# Patient Record
Sex: Male | Born: 1955 | Race: White | Hispanic: No | Marital: Married | Smoking: Former smoker
Health system: Southern US, Community
[De-identification: ages and names within clinical notes are randomized; demographics above are authoritative.]

## PROBLEM LIST (undated history)

## (undated) DIAGNOSIS — I10 Essential (primary) hypertension: Secondary | ICD-10-CM

## (undated) DIAGNOSIS — E119 Type 2 diabetes mellitus without complications: Secondary | ICD-10-CM

## (undated) DIAGNOSIS — Z803 Family history of malignant neoplasm of breast: Secondary | ICD-10-CM

## (undated) DIAGNOSIS — Z8042 Family history of malignant neoplasm of prostate: Secondary | ICD-10-CM

## (undated) DIAGNOSIS — N390 Urinary tract infection, site not specified: Secondary | ICD-10-CM

## (undated) DIAGNOSIS — T7840XA Allergy, unspecified, initial encounter: Secondary | ICD-10-CM

## (undated) DIAGNOSIS — K635 Polyp of colon: Secondary | ICD-10-CM

## (undated) DIAGNOSIS — E785 Hyperlipidemia, unspecified: Secondary | ICD-10-CM

## (undated) HISTORY — DX: Family history of malignant neoplasm of prostate: Z80.42

## (undated) HISTORY — PX: WISDOM TOOTH EXTRACTION: SHX21

## (undated) HISTORY — DX: Hyperlipidemia, unspecified: E78.5

## (undated) HISTORY — DX: Allergy, unspecified, initial encounter: T78.40XA

## (undated) HISTORY — DX: Type 2 diabetes mellitus without complications: E11.9

## (undated) HISTORY — PX: SURGERY SCROTAL / TESTICULAR: SUR1316

## (undated) HISTORY — DX: Polyp of colon: K63.5

## (undated) HISTORY — PX: CERVICAL DISC SURGERY: SHX588

## (undated) HISTORY — DX: Essential (primary) hypertension: I10

## (undated) HISTORY — DX: Family history of malignant neoplasm of breast: Z80.3

## (undated) HISTORY — PX: COLONOSCOPY: SHX174

## (undated) HISTORY — DX: Urinary tract infection, site not specified: N39.0

---

## 1999-01-25 ENCOUNTER — Emergency Department (HOSPITAL_COMMUNITY): Admission: EM | Admit: 1999-01-25 | Discharge: 1999-01-25 | Payer: Self-pay | Admitting: Emergency Medicine

## 2007-10-17 ENCOUNTER — Other Ambulatory Visit: Payer: Self-pay | Admitting: Neurosurgery

## 2007-10-23 ENCOUNTER — Observation Stay (HOSPITAL_COMMUNITY): Admission: RE | Admit: 2007-10-23 | Discharge: 2007-10-24 | Payer: Self-pay | Admitting: Neurosurgery

## 2007-10-25 ENCOUNTER — Emergency Department (HOSPITAL_COMMUNITY): Admission: EM | Admit: 2007-10-25 | Discharge: 2007-10-25 | Payer: Self-pay | Admitting: Emergency Medicine

## 2009-06-27 IMAGING — CR DG NECK SOFT TISSUE
2 series · 2 of 2 positions shown · non-contrast
Comparison: Neck soft tissues of 10/23/07.

CLINICAL DATA: Vomiting.
 NECK SOFT TISSUES:

[view not recorded (1 of 2)]
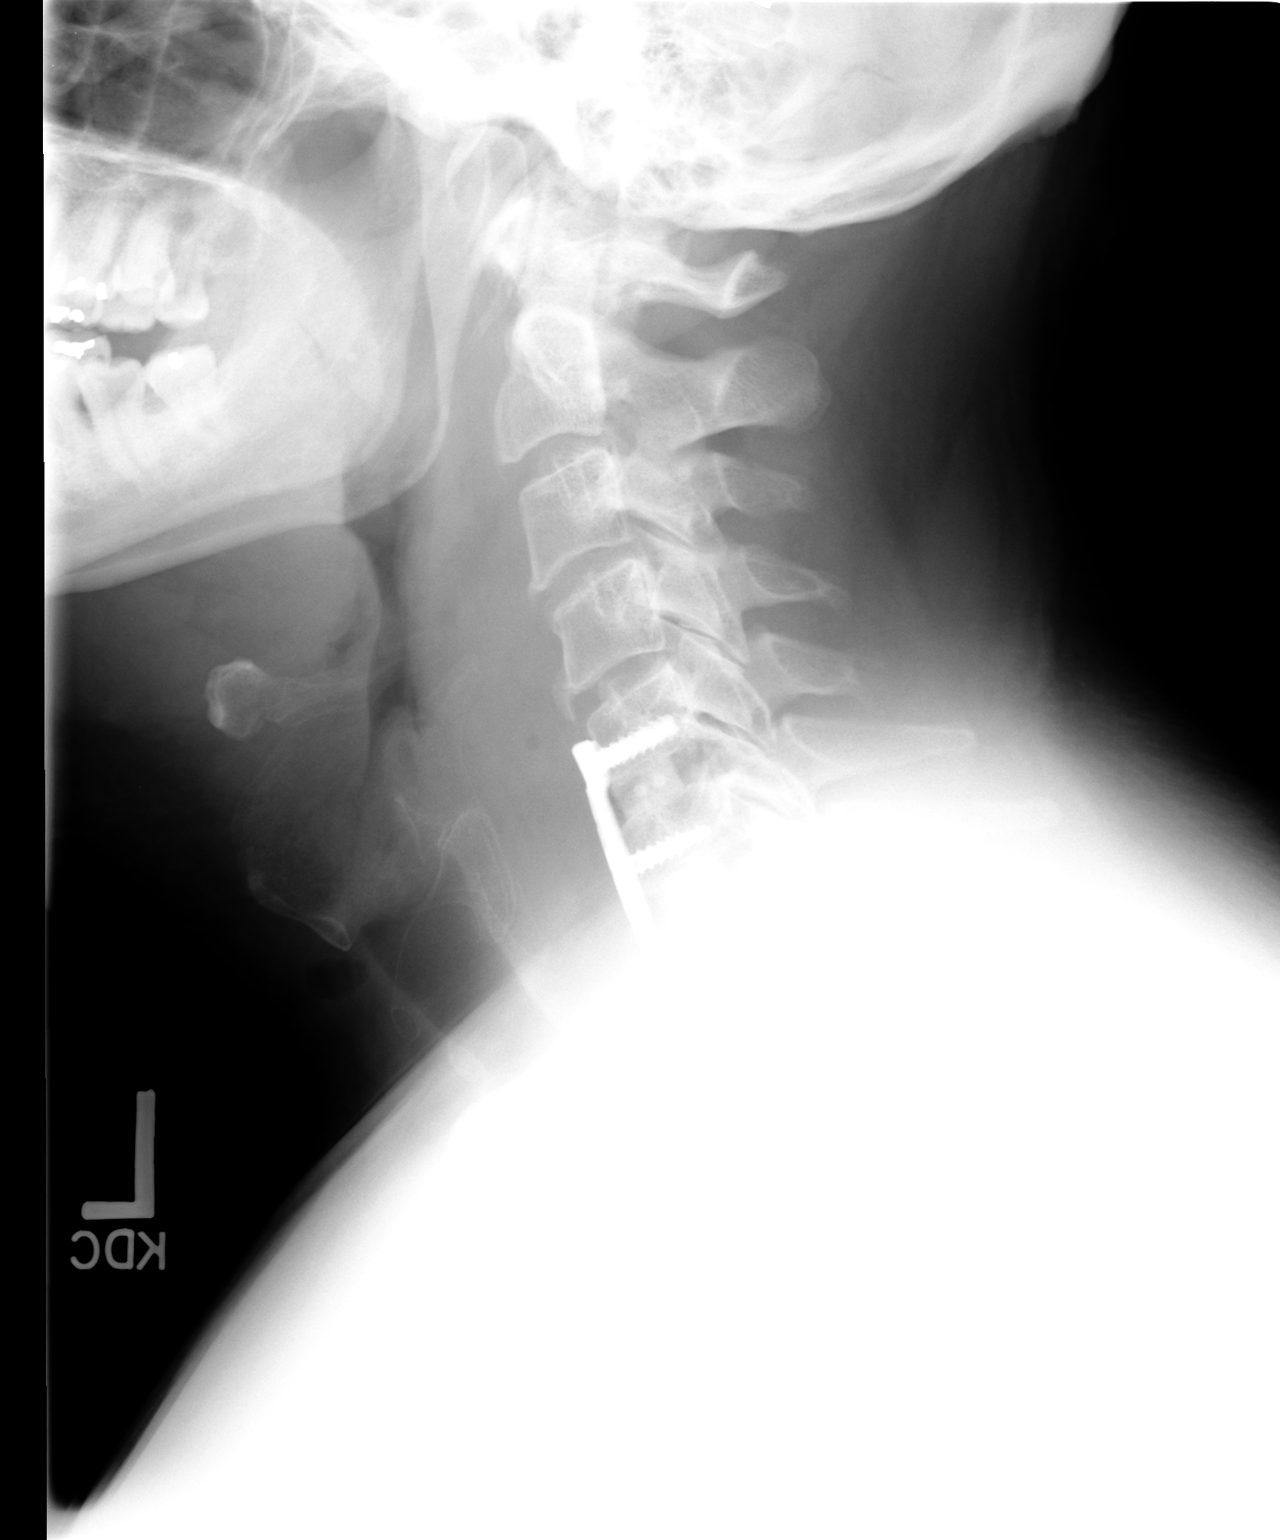

[view not recorded (2 of 2)]
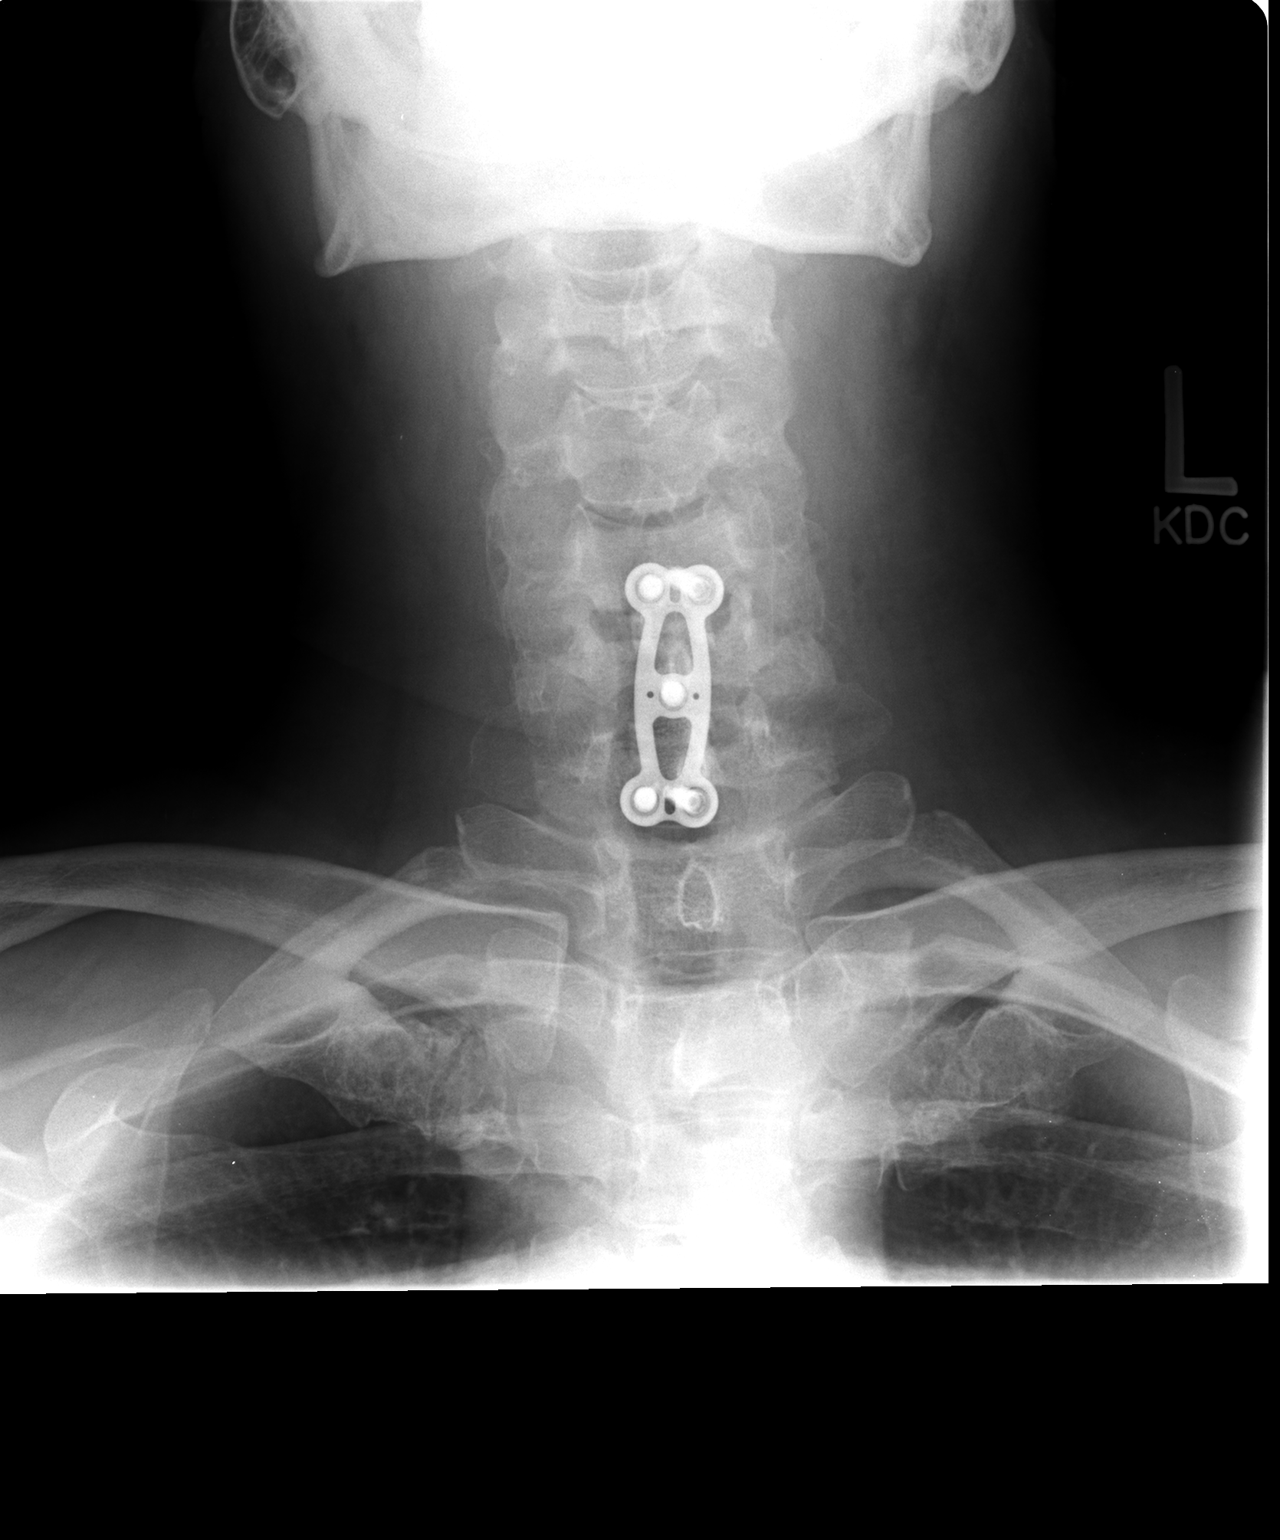

[2 of 2 positions shown; findings below may reference images not displayed]

FINDINGS: There is interval increase in prevertebral soft tissue swelling, which measures 3 mm from the anterior aspect of C4 compared to 20 mm on prior.  There is anterior cervical fixation beginning at C5.  There is a tiny round pocket of gas in the prevertebral soft tissue swelling measuring 3 mm.
IMPRESSION: Interval increase in swelling of prevertebral soft tissues.

## 2011-04-17 NOTE — Op Note (Signed)
Joshua [name], Joshua [name] NO.:  [id number]   MEDICAL RECORD NO.:  [id number]          PATIENT TYPE:  INP   LOCATION:  3012                         FACILITY:  [facility]   PHYSICIAN:  Hewitt Shorts, M.D.DATE OF BIRTH:  1956/09/11   DATE OF PROCEDURE:  DATE OF DISCHARGE:                               OPERATIVE REPORT   POSTOPERATIVE DIAGNOSES:  C5-6 and C6-7 cervical disc herniation,  cervical spondylosis, cervical degenerative disease, and cervical  radiculopathy.   POSTOPERATIVE DIAGNOSES:  C5-6 and C6-7 cervical disc herniation,  cervical spondylosis, cervical degenerative disease, and cervical  radiculopathy.   PROCEDURE:  C5-6 and C6-7 anterior cervical discectomy and arthrodesis  with Allograft and Tether cervical plating.   SURGEON:  Hewitt Shorts, M.D.   ASSISTANTS:  Nelia Shi. Georgina Peer and Stefani Dama, M.D.   ANESTHESIA:  General endotracheal.   INDICATIONS:  This is a pleasant 55 year old man who presented with  bilateral cervical radiculopathy.  He was found to have advanced  spondylosis and degenerative disease with superimposed disc herniation  at a C5-6 and C6-7 levels.  EMG nerve conduction studies also revealed  bilateral carpal tunnel syndrome.  Decision was made to proceed with  cervical decompression.  He may well require ventral carpal tunnel  decompression.   DESCRIPTION OF PROCEDURE:  The patient was brought to the operating room  and placed under general endotracheal anesthesia.  The patient was  placed on 10 pounds of Holter traction.  The neck was prepped with  Betadine soap and solution and draped in sterile fashion.  A horizontal  incision was made in the left side of neck.  The line of the incision  was treated with local anesthetic with epinephrine.  Dissection was  carried down to the subcutaneous tissue.  Bipolar cautery and  electrocautery was used to maintain hemostasis.  Dissection was then  carried out through  the platysma and then dissection was carried down  through an avascular plane leaving the sternocleidomastoid, carotid  artery, and jugular vein laterally, and trachea and esophagus medially.  The ventral aspect of the vertebral column was identified and localizing  x-ray taken and C5-6 and C6-7 intervertebral disc space was identified.  Discectomy was begun with incision of the annulus,  continued with  microcurette and pituitary rongeurs.  The microscope was draped and  brought into the field to provide additional navigation, illumination,  and visualization.  The remainder of the decompression was performed  using microdissection and microsurgical technique.  Significant anterior  osteophytic overgrowth was removed using an osteophyte removal tool  along with the X-Max drill.  The cauterized endplates were removed using  the microcurette along with the X-Max drill and then posterior  osteophytic overgrowth was removed using the X-Max drill and a 2-mm  Kerrison punch with a thin footplate.  The posterior longitudinal  ligament was carefully removed, and then we encountered disc herniation  that was similarly removed.  We were able to decompress the spinal canal  and thecal sac bilaterally at each level and then we turned our  attention to the neural  foramina at each level.  There was  significant  spondylitic encroachment at C5-6.  This was carefully removed and we  were able to decompress the exiting C6 and C7 nerve roots bilaterally at  each level.  We then established hemostasis once the decompression was  completed with the use of Gelfoam soaked in thrombin and once hemostasis  was established, we measured the height of the intravertebral disc  spaces.  We selected a 7-mm graft for C5-6 and then 8-mm graft for C6-7.  Each piece of allograft was hydrated in saline solution and then  positioned in the intravertebral disc space and countersunk.  We then  discontinued the cervical  traction and selected a 35-mm Tether cervical  plating.  It was positioned over the fusion construct and secured to the  vertebra with 4 x 15 mm variable-angle screws placing a pair of screws  at C5, and another pair at C7, and a single screw at C6.  Each screw  level was drilled and the screws placed in alternating fashion.  Once  all 5 screws were place, final tightening was performed, and then the  wound was irrigated with bacitracin solution and checked for hemostasis,  which was established and confirmed.  X-ray was taken, which only  visualized the superior aspect of the fusion construct with the screws  appearing in good position at C5 and the graft appeared in position at  the C5-6 level.  The remainder of the construct was obscured by his  shoulders.  We then again confirmed hemostasis and then proceeded with  closure.  The platysma was closed with interrupted inverted 2-0 running  Vicryl sutures.  The subcutaneous and subcuticular layer were closed  with interrupted inverted 3-0 running Vicryl sutures.  The skin was  reapproximated with Dermabond.  The procedure was tolerated well.  The  estimated blood loss was 50 mL.  Sponge counts were correct.  Following  surgery, the patient was reversed from the anesthetic, extubated, and  transferred to the recovery room for further care.      Hewitt Shorts, M.D.  Electronically Signed     RWN/MEDQ  D:  10/23/2007  T:  10/23/2007  Job:  [id number]

## 2011-09-11 LAB — COMPREHENSIVE METABOLIC PANEL
ALT: 49
AST: 28
Albumin: 4.1
Alkaline Phosphatase: 62
BUN: 17
CO2: 27
Calcium: 9.3
Chloride: 105
Creatinine, Ser: 0.98
GFR calc Af Amer: >60
GFR calc non Af Amer: 60 — ABNORMAL LOW
Glucose, Bld: 111 — ABNORMAL HIGH
Potassium: 4.8
Sodium: 137
Total Bilirubin: 0.7
Total Protein: 7.2

## 2011-09-11 LAB — CBC
HCT: 45.3
Hemoglobin: 15.7
MCHC: 34.6
MCV: 88.8
Platelets: 253
RBC: 5.11
RDW: 11.8
WBC: 9.5

## 2013-12-03 DIAGNOSIS — N390 Urinary tract infection, site not specified: Secondary | ICD-10-CM

## 2013-12-03 HISTORY — DX: Urinary tract infection, site not specified: N39.0

## 2014-02-12 ENCOUNTER — Encounter: Payer: Self-pay | Admitting: Gastroenterology

## 2014-03-31 ENCOUNTER — Encounter: Payer: Self-pay | Admitting: Gastroenterology

## 2014-10-08 ENCOUNTER — Encounter: Payer: Self-pay | Admitting: Gastroenterology

## 2014-11-29 ENCOUNTER — Ambulatory Visit (AMBULATORY_SURGERY_CENTER): Payer: Self-pay | Admitting: *Deleted

## 2014-11-29 VITALS — Ht 73.0 in | Wt 243.2 lb

## 2014-11-29 DIAGNOSIS — Z1211 Encounter for screening for malignant neoplasm of colon: Secondary | ICD-10-CM

## 2014-11-29 MED ORDER — MOVIPREP 100 G PO SOLR: 1.0000 | Freq: Once | ORAL | Status: DC

## 2014-11-29 NOTE — Progress Notes (Signed)
No home 02 use, no blood thinners. ewm  No egg or soy allergy. ewm  No issues with past sedation. ewm  No diet pills. ewm

## 2014-12-14 ENCOUNTER — Ambulatory Visit (AMBULATORY_SURGERY_CENTER): Payer: Commercial Managed Care - PPO | Admitting: Gastroenterology

## 2014-12-14 ENCOUNTER — Encounter: Payer: Self-pay | Admitting: Gastroenterology

## 2014-12-14 VITALS — BP 112/53 | HR 52 | Temp 97.9°F | Resp 16 | Ht 73.0 in | Wt 243.2 lb

## 2014-12-14 DIAGNOSIS — D120 Benign neoplasm of cecum: Secondary | ICD-10-CM

## 2014-12-14 DIAGNOSIS — Z1211 Encounter for screening for malignant neoplasm of colon: Secondary | ICD-10-CM

## 2014-12-14 DIAGNOSIS — D124 Benign neoplasm of descending colon: Secondary | ICD-10-CM

## 2014-12-14 DIAGNOSIS — D123 Benign neoplasm of transverse colon: Secondary | ICD-10-CM

## 2014-12-14 DIAGNOSIS — D125 Benign neoplasm of sigmoid colon: Secondary | ICD-10-CM

## 2014-12-14 LAB — GLUCOSE, CAPILLARY
GLUCOSE-CAPILLARY: 141 mg/dL — AB (ref 70–99)
GLUCOSE-CAPILLARY: 101 mg/dL — AB (ref 70–99)

## 2014-12-14 MED ORDER — SODIUM CHLORIDE 0.9 % IV SOLN: 500.0000 mL | INTRAVENOUS | Status: DC

## 2014-12-14 NOTE — Progress Notes (Signed)
Stable to RR 

## 2014-12-14 NOTE — Op Note (Signed)
[facility]  [address]. [place], [address]   COLONOSCOPY PROCEDURE REPORT  PATIENT: Joshua [name], Joshua [name]  MR#: [id number] BIRTHDATE: 06-Apr-1956 , 58  yrs. old GENDER: male ENDOSCOPIST: Milus Banister, MD REFERRED VO:UZHQUIQ Reynaldo Minium, M.D. PROCEDURE DATE:  12/14/2014 PROCEDURE:   Colonoscopy with snare polypectomy and Submucosal injection, any substance First Screening Colonoscopy - Avg.  risk and is 50 yrs.  old or older Yes.  Prior Negative Screening - Now for repeat screening. N/A  History of Adenoma - Now for follow-up colonoscopy & has been > or = to 3 yrs.  N/A  Polyps Removed Today? Yes. ASA CLASS:   Class II INDICATIONS:average risk for colon cancer. MEDICATIONS: Monitored anesthesia care, Propofol 450 mg IV, and lidocaine 40mg  IV  DESCRIPTION OF PROCEDURE:   After the risks benefits and alternatives of the procedure were thoroughly explained, informed consent was obtained.  The digital rectal exam revealed no abnormalities of the rectum.   The LB [id number]  endoscope was introduced through the anus and advanced to the cecum, which was identified by both the appendix and ileocecal valve. No adverse events experienced.   The quality of the prep was good.  The instrument was then slowly withdrawn as the colon was fully examined.  COLON FINDINGS: Thirteen polyps were found, removed and sent to pathology.  11 of these were sessile, ranged in size from 2-65mm across, located in ascending, descending and sigmoid segments, all removed with cold snare (jar 1).  One was 42mm, sessile, located in descending segment, removed with cold snare, (jar 2).  The last was semipedunculated, a bit ulcerated appearing, located in sigmoid segment, 41mm across, removed with snare/cautery (jar 3).  This last site was labeled with submucosal injection of Spot following resection.  The examination was otherwise normal.  Retroflexed views revealed no abnormalities. The time to  cecum=2 minutes 38 seconds.  Withdrawal time=24 minutes 26 seconds.  The scope was withdrawn and the procedure completed. COMPLICATIONS: There were no immediate complications.  ENDOSCOPIC IMPRESSION: Thirteen polyps were found, removed and sent to pathology. The examination was otherwise normal  RECOMMENDATIONS: Await final pathology.  You will likely need repeat colonoscopy in 1 year given the large number of polyps removed today and will consider referral to genetics councilor given the same.  eSigned:  Milus Banister, MD 12/14/2014 10:48 AM

## 2014-12-14 NOTE — Patient Instructions (Addendum)
YOU HAD AN ENDOSCOPIC PROCEDURE TODAY AT THE [facility]: Refer to the procedure report that was given to you for any specific questions about what was found during the examination.  If the procedure report does not answer your questions, please call your gastroenterologist to clarify.  If you requested that your care partner not be given the details of your procedure findings, then the procedure report has been included in a sealed envelope for you to review at your convenience later.  YOU SHOULD EXPECT: Some feelings of bloating in the abdomen. Passage of more gas than usual.  Walking can help get rid of the air that was put into your GI tract during the procedure and reduce the bloating. If you had a lower endoscopy (such as a colonoscopy or flexible sigmoidoscopy) you may notice spotting of blood in your stool or on the toilet paper. If you underwent a bowel prep for your procedure, then you may not have a normal bowel movement for a few days.  DIET: Your first meal following the procedure should be a light meal and then it is ok to progress to your normal diet.  A half-sandwich or bowl of soup is an example of a good first meal.  Heavy or fried foods are harder to digest and may make you feel nauseous or bloated.  Likewise meals heavy in dairy and vegetables can cause extra gas to form and this can also increase the bloating.  Drink plenty of fluids but you should avoid alcoholic beverages for 24 hours.  ACTIVITY: Your care partner should take you home directly after the procedure.  You should plan to take it easy, moving slowly for the rest of the day.  You can resume normal activity the day after the procedure however you should NOT DRIVE or use heavy machinery for 24 hours (because of the sedation medicines used during the test).    SYMPTOMS TO REPORT IMMEDIATELY: A gastroenterologist can be reached at any hour.  During normal business hours, 8:30 AM to 5:00 PM Monday through Friday,  call [phone].  After hours and on weekends, please call the GI answering service at [phone] who will take a message and have the physician on call contact you.   Following lower endoscopy (colonoscopy or flexible sigmoidoscopy):  Excessive amounts of blood in the stool  Significant tenderness or worsening of abdominal pains  Swelling of the abdomen that is new, acute  Fever of 100F or higher  FOLLOW UP: If any biopsies were taken you will be contacted by phone or by letter within the next 1-3 weeks.  Call your gastroenterologist if you have not heard about the biopsies in 3 weeks.  Our staff will call the home number listed on your records the next business day following your procedure to check on you and address any questions or concerns that you may have at that time regarding the information given to you following your procedure. This is a courtesy call and so if there is no answer at the home number and we have not heard from you through the emergency physician on call, we will assume that you have returned to your regular daily activities without incident.  SIGNATURES/CONFIDENTIALITY: You and/or your care partner have signed paperwork which will be entered into your electronic medical record.  These signatures attest to the fact that that the information above on your After Visit Summary has been reviewed and is understood.  Full responsibility of the confidentiality of this   discharge information lies with you and/or your care-partner. Await pathology  Continue your normal medications  Please read over handout about polyps

## 2014-12-14 NOTE — Progress Notes (Signed)
Called to room to assist during endoscopic procedure.  Patient ID and intended procedure confirmed with present staff. Received instructions for my participation in the procedure from the performing physician.  

## 2014-12-15 ENCOUNTER — Telehealth: Payer: Self-pay | Admitting: *Deleted

## 2014-12-15 NOTE — Telephone Encounter (Signed)
#   left had answering machine said this is steve, did not leave message ,wasnt sure if had correct #

## 2014-12-27 ENCOUNTER — Telehealth: Payer: Self-pay | Admitting: Gastroenterology

## 2014-12-27 DIAGNOSIS — D369 Benign neoplasm, unspecified site: Secondary | ICD-10-CM

## 2014-12-27 NOTE — Telephone Encounter (Signed)
Referral has been made and recall in EPIC  Left message on machine to call back

## 2014-12-27 NOTE — Telephone Encounter (Signed)
The pt is aware and will call if he does not hear from them by next week.

## 2014-12-27 NOTE — Telephone Encounter (Signed)
Joshua [name], She needs recall colonoscopy in 1 year. 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

## 2014-12-28 ENCOUNTER — Telehealth: Payer: Self-pay | Admitting: Genetic Counselor

## 2014-12-28 NOTE — Telephone Encounter (Signed)
LEFT MESSAGE FOR PATIENT TO RETURN CALL TO SCHEDULE GENETIC APPT.  °

## 2014-12-28 NOTE — Telephone Encounter (Signed)
S/W PATIENT AND GAVE GENETIC APPT FOR 02/03 @ 9 W/KAREN POWELL REFERRING DR. Quillian Quince JACOBS DX-ADENOMATOUS POLYPS

## 2015-01-05 ENCOUNTER — Encounter: Payer: Self-pay | Admitting: Genetic Counselor

## 2015-01-05 ENCOUNTER — Other Ambulatory Visit: Payer: Commercial Managed Care - PPO

## 2015-01-05 ENCOUNTER — Ambulatory Visit (HOSPITAL_BASED_OUTPATIENT_CLINIC_OR_DEPARTMENT_OTHER): Payer: Commercial Managed Care - PPO | Admitting: Genetic Counselor

## 2015-01-05 DIAGNOSIS — Z808 Family history of malignant neoplasm of other organs or systems: Secondary | ICD-10-CM

## 2015-01-05 DIAGNOSIS — Z8042 Family history of malignant neoplasm of prostate: Secondary | ICD-10-CM

## 2015-01-05 DIAGNOSIS — Z860100 Personal history of colon polyps, unspecified: Secondary | ICD-10-CM

## 2015-01-05 DIAGNOSIS — K635 Polyp of colon: Secondary | ICD-10-CM | POA: Insufficient documentation

## 2015-01-05 DIAGNOSIS — Z803 Family history of malignant neoplasm of breast: Secondary | ICD-10-CM

## 2015-01-05 DIAGNOSIS — Z8051 Family history of malignant neoplasm of kidney: Secondary | ICD-10-CM

## 2015-01-05 DIAGNOSIS — Z315 Encounter for genetic counseling: Secondary | ICD-10-CM

## 2015-01-05 DIAGNOSIS — Z8052 Family history of malignant neoplasm of bladder: Secondary | ICD-10-CM

## 2015-01-05 NOTE — Progress Notes (Signed)
REFERRING PROVIDER: Geoffery Lyons, MD [place], [place] [address]   Joshua Loffler, MD  PRIMARY PROVIDER:  Geoffery Lyons, MD  PRIMARY REASON FOR VISIT:  1. History of colonic polyps   2. Family history of breast cancer in mother   30. Family history of prostate cancer in father   78. Family history of brain cancer   5. Family history of bladder cancer   6. Family history of kidney cancer      HISTORY OF PRESENT ILLNESS:   Joshua [name], a 59 y.o. male, was seen for a [place] cancer genetics consultation at the request of Dr. Ardis [name] due to a personal history of 13 colon polyps and a family history of cancer.  Joshua [name] presents to clinic today to discuss the possibility of a hereditary predisposition to cancer, genetic testing, and to further clarify his future cancer risks, as well as potential cancer risks for family members.   In 2016, at the age of 29, Joshua [name] was diagnosed with colon polyposis.  A total of 13 polyps were found.  Twelve were tubular adenomas and the remaining was a tubular villous polyp. This was treated with removal of polyps and a recall for one year. Joshua [name] has no personal history of cancer.    CANCER HISTORY:   No history exists.      Past Medical History  Diagnosis Date  . Diabetes mellitus without complication   . Colon polyps   . Family history of breast cancer in mother   . Family history of prostate cancer in father     Past Surgical History  Procedure Laterality Date  . Cervical disc surgery      C4,5  . Wisdom tooth extraction    . Surgery scrotal / testicular      fluid drained as child    History   Social History  . Marital Status: Married    Spouse Name: N/A    Number of Children: 2  . Years of Education: N/A   Social History Main Topics  . Smoking status: Former Smoker    Types: Cigarettes  . Smokeless tobacco: Never Used     Comment: 1 month age 11  . Alcohol Use: 0.0 oz/week    0 Not specified  per week     Comment: socially  . Drug Use: No  . Sexual Activity: None   Other Topics Concern  . None   Social History Narrative     FAMILY HISTORY:  We obtained a detailed, 4-generation family history.  Significant diagnoses are listed below: Family History  Problem Relation Age of Onset  . Breast cancer Mother 49  . Bone cancer Mother     started as breast, metastasized  . Colon cancer Neg Hx   . Prostate cancer Father   . Cancer Father 67    possible kidney and bladder cancer - heavy smoker  . Brain cancer Maternal Aunt   . Cirrhosis Maternal Grandmother   . Lung cancer Maternal Grandfather   . Cancer Paternal Grandmother     NOS  . Cancer Paternal Grandfather     NOS  . Cancer Paternal Aunt     NOS   Joshua [name] does not have specifics about his family history of cancer.  Patient's maternal ancestors are of Caucasian descent, and paternal ancestors are of [place] and [place] descent. There is no reported Ashkenazi Jewish ancestry. There is no known consanguinity.  GENETIC COUNSELING ASSESSMENT: Joshua [name]  is a 59 y.o. male with a personal history of 13 colon polyps on his first colonoscopy and a family history of cancer which somewhat suggestive of a polyposis condition and predisposition to cancer. We, therefore, discussed and recommended the following at today's visit.   DISCUSSION: We reviewed the characteristics, features and inheritance patterns of hereditary cancer syndromes. We reviewed that based on the family history he provided, Joshua [name] does not have a pattern of cancer that is recognizable as a hereditary condition.  However, individuals who have 10 or more polyps should consider genetic testing to determine if medical management needs to be changed, and other family members notified of a potential risk.  We also discussed genetic testing, including the appropriate family members to test, the process of testing, insurance coverage and turn-around-time for  results. We discussed the implications of a negative, positive and/or variant of uncertain significant result.      Joshua [name] was very anxious about learning about the colon polyps, and had a difficult time making decisions about testing. We discussed the pros and cons of testing, and how genetic testing may be reassuring or allow additional screening.  Based on his family history of cancer and his colon polyps, we recommended Joshua [name] pursue genetic testing for the ColoNext gene panel. The ColoNext gene panel offered by [facility] and includes sequencing and rearrangement analysis for the following 17 genes: APC, BMPR1A, CDH1, CHEK2, EPCAM, GREM1, MLH1, MSH2, MSH6, MUTYH, PMS2, POLD1, POLE, PTEN, SMAD4, STK11, and TP53.  PLAN: After considering the risks, benefits, and limitations,Joshua [name]  provided informed consent to pursue genetic testing and the blood sample was sent to [facility] for analysis of the ColoNext. Results should be available within approximately 3-4 weeks' time, at which point they will be disclosed by telephone to Joshua [name], as will any additional recommendations warranted by these results. Joshua [name] will receive a summary of his genetic counseling visit and a copy of his results once available. This information will also be available in Epic. We encouraged Joshua [name] to remain in contact with cancer genetics annually so that we can continuously update the family history and inform him of any changes in cancer genetics and testing that may be of benefit for his family. Joshua [name] questions were answered to his satisfaction today. Our contact information was provided should additional questions or concerns arise.  Lastly, we encouraged Joshua [name] to remain in contact with cancer genetics annually so that we can continuously update the family history and inform him of any changes in cancer genetics and testing that may be of benefit for this family.   Mr.  [name]  questions were answered to his satisfaction today. Our contact information was provided should additional questions or concerns arise. Thank you for the referral and allowing [place] to share in the care of your patient.   Gaia Gullikson P. Florene Glen, [place], [facility] Certified Genetic Counselor [email or web address] phone: [phone]  The patient was seen for a total of 60 minutes in face-to-face genetic counseling.  This patient was discussed with Drs. Magrinat, Lindi Adie and/or Burr Medico who agrees with the above.    _______________________________________________________________________ For Office Staff:  Number of people involved in session: 1 Was an Intern/ student involved with case: no

## 2015-01-28 ENCOUNTER — Encounter: Payer: Self-pay | Admitting: Genetic Counselor

## 2015-01-28 ENCOUNTER — Telehealth: Payer: Self-pay | Admitting: Genetic Counselor

## 2015-01-28 DIAGNOSIS — Z1379 Encounter for other screening for genetic and chromosomal anomalies: Secondary | ICD-10-CM | POA: Insufficient documentation

## 2015-01-28 NOTE — Telephone Encounter (Signed)
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

## 2015-01-28 NOTE — Progress Notes (Signed)
HPI: [name] was previously seen in the [facility] clinic due to a personal history of colon polyposis and famil history of cancer and concerns regarding a hereditary predisposition to cancer. Please refer to our prior cancer genetics clinic note for more information regarding [name] medical, social and family histories, and our assessment and recommendations, at the time. [name] recent genetic test results were disclosed to him, as were recommendations warranted by these results. These results and recommendations are discussed in more detail below.  GENETIC TEST RESULTS: At the time of [name] visit, we recommended he pursue genetic testing of the ColoNext gene panel. The ColoNext gene panel offered by [facility] and includes sequencing and rearrangement analysis for the following 17 genes: APC, BMPR1A, CDH1, CHEK2, EPCAM, GREM1, MLH1, MSH2, MSH6, MUTYH, PMS2, POLD1, POLE, PTEN, SMAD4, STK11, and TP53.   The report date is January 27, 2015.  Testing was performed at [facility]. 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 The test report has been scanned into EPIC and is located under the Media tab.   We discussed with Joshua [name] that since the current genetic testing is not perfect, it is possible there may be a gene mutation in one of these genes that current testing cannot detect, but that chance is small. We also discussed, that it is possible that another gene that has not yet been discovered, or that we have not yet tested, is responsible for the cancer diagnoses in the family, and it is, therefore, important to remain in touch with cancer genetics in the future so that we can continue to offer [name] the most up to date genetic testing.   CANCER SCREENING RECOMMENDATIONS: This result is reassuring and suggests that Joshua [name] personal history of colon  polyposis was most likely not due to an inherited predisposition associated with one of these genes. Most cancers happen by chance and this negative test, along with details of his family history, suggests that his colon polyposis falls into this category. We, therefore, recommended he continue to follow the cancer management and screening guidelines provided by his oncology and primary providers.   RECOMMENDATIONS FOR FAMILY MEMBERS: Women in this family might be at some increased risk of developing cancer, over the general population risk, simply due to the family history of cancer. We recommended women in this family have a yearly mammogram beginning at age 33, or 66 years younger than the earliest onset of cancer, an an annual clinical breast exam, and perform monthly breast self-exams. Women in this family should also have a gynecological exam as recommended by their primary provider. All family members should have a colonoscopy by age 43.  FOLLOW-UP: Lastly, we discussed with [name] that cancer genetics is a rapidly advancing field and it is possible that new genetic tests will be appropriate for him and/or his family members in the future. We encouraged him to remain in contact with cancer genetics on an annual basis so we can update his personal and family histories and let him know of advances in cancer genetics that may benefit this family.   Our contact number was provided. [name] questions were answered to his satisfaction, and she knows he is welcome to call us at anytime with additional questions or concerns.   Roma Kayser, MS, [facility] Certified Genetic Counselor [email or web address]

## 2015-03-26 ENCOUNTER — Encounter (HOSPITAL_COMMUNITY): Payer: Self-pay | Admitting: Nurse Practitioner

## 2015-03-26 ENCOUNTER — Emergency Department (HOSPITAL_COMMUNITY)
Admission: EM | Admit: 2015-03-26 | Discharge: 2015-03-26 | Disposition: A | Discharge status: Home / Self Care | Payer: Commercial Managed Care - PPO | Attending: Emergency Medicine | Admitting: Emergency Medicine

## 2015-03-26 DIAGNOSIS — Z86010 Personal history of colonic polyps: Secondary | ICD-10-CM | POA: Diagnosis not present

## 2015-03-26 DIAGNOSIS — Z880 Allergy status to penicillin: Secondary | ICD-10-CM | POA: Insufficient documentation

## 2015-03-26 DIAGNOSIS — Z8781 Personal history of (healed) traumatic fracture: Secondary | ICD-10-CM | POA: Diagnosis not present

## 2015-03-26 DIAGNOSIS — R319 Hematuria, unspecified: Secondary | ICD-10-CM

## 2015-03-26 DIAGNOSIS — N390 Urinary tract infection, site not specified: Secondary | ICD-10-CM | POA: Insufficient documentation

## 2015-03-26 DIAGNOSIS — Z79899 Other long term (current) drug therapy: Secondary | ICD-10-CM | POA: Diagnosis not present

## 2015-03-26 DIAGNOSIS — E119 Type 2 diabetes mellitus without complications: Secondary | ICD-10-CM | POA: Insufficient documentation

## 2015-03-26 DIAGNOSIS — Z794 Long term (current) use of insulin: Secondary | ICD-10-CM | POA: Diagnosis not present

## 2015-03-26 LAB — COMPREHENSIVE METABOLIC PANEL
ALBUMIN: 3.8 g/dL (ref 3.5–5.2)
ALK PHOS: 70 U/L (ref 39–117)
ALT: 19 U/L (ref 0–53)
ANION GAP: 11 (ref 5–15)
AST: 15 U/L (ref 0–37)
BUN: 21 mg/dL (ref 6–23)
CALCIUM: 9.1 mg/dL (ref 8.4–10.5)
CO2: 27 mmol/L (ref 19–32)
Chloride: 98 mmol/L (ref 96–112)
Creatinine, Ser: 1.21 mg/dL (ref 0.50–1.35)
GFR calc Af Amer: 73 mL/min — ABNORMAL LOW (ref >90)
GFR calc non Af Amer: 63 mL/min — ABNORMAL LOW (ref >90)
GLUCOSE: 164 mg/dL — AB (ref 70–99)
Potassium: 4.1 mmol/L (ref 3.5–5.1)
SODIUM: 136 mmol/L (ref 135–145)
Total Bilirubin: 1.8 mg/dL — ABNORMAL HIGH (ref 0.3–1.2)
Total Protein: 7.8 g/dL (ref 6.0–8.3)

## 2015-03-26 LAB — CBC
HEMATOCRIT: 46.8 % (ref 39.0–52.0)
HEMOGLOBIN: 15.7 g/dL (ref 13.0–17.0)
MCH: 30 pg (ref 26.0–34.0)
MCHC: 33.5 g/dL (ref 30.0–36.0)
MCV: 89.5 fL (ref 78.0–100.0)
Platelets: 237 10*3/uL (ref 150–400)
RBC: 5.23 MIL/uL (ref 4.22–5.81)
RDW: 13.3 % (ref 11.5–15.5)
WBC: 24.1 10*3/uL — AB (ref 4.0–10.5)

## 2015-03-26 LAB — URINALYSIS, ROUTINE W REFLEX MICROSCOPIC
BILIRUBIN URINE: NEGATIVE
KETONES UR: 40 mg/dL — AB
LEUKOCYTES UA: NEGATIVE
Nitrite: NEGATIVE
PH: 5.5 (ref 5.0–8.0)
Protein, ur: >300 mg/dL — AB
Urobilinogen, UA: 0.2 mg/dL (ref 0.0–1.0)

## 2015-03-26 LAB — URINE MICROSCOPIC-ADD ON

## 2015-03-26 LAB — I-STAT CG4 LACTIC ACID, ED: LACTIC ACID, VENOUS: 1.82 mmol/L (ref 0.5–2.0)

## 2015-03-26 MED ORDER — CEPHALEXIN 500 MG PO CAPS: 500.0000 mg | ORAL_CAPSULE | Freq: Three times a day (TID) | ORAL | Status: DC

## 2015-03-26 MED ORDER — DEXTROSE 5 % IV SOLN
1.0000 g | Freq: Once | INTRAVENOUS | Status: AC
  Administered 2015-03-26: 1 g via INTRAVENOUS
  Filled 2015-03-26: qty 10

## 2015-03-26 MED ORDER — OXYCODONE-ACETAMINOPHEN 5-325 MG PO TABS: 1.0000 | ORAL_TABLET | ORAL | Status: DC | PRN

## 2015-03-26 MED ORDER — SODIUM CHLORIDE 0.9 % IV BOLUS (SEPSIS)
1000.0000 mL | Freq: Once | INTRAVENOUS | Status: AC
  Administered 2015-03-26: 1000 mL via INTRAVENOUS

## 2015-03-26 MED ORDER — ONDANSETRON 8 MG PO TBDP: 8.0000 mg | ORAL_TABLET | Freq: Three times a day (TID) | ORAL | Status: DC | PRN

## 2015-03-26 NOTE — ED Provider Notes (Signed)
CSN: [id number]     Arrival date & time 03/26/15  1652 History   First MD Initiated Contact with Patient 03/26/15 1931     Chief Complaint  Patient presents with  . Hematuria     (Consider location/radiation/quality/duration/timing/severity/associated sxs/prior Treatment) HPI  59 year old male presents today complaining of weakness and feeling bad that began yesterday. He describes this as nausea and generalized weakness. He has had increased frequency of urination with hematuria. In taking by mouth without difficulty. He denies any abdominal pain or diarrhea. He has a history of diabetes and has been taking his medications as usual with no problems with his blood sugars. He is not on any blood thinners and has no previous history of hematuria.  Past Medical History  Diagnosis Date  . Diabetes mellitus without complication   . Colon polyps   . Family history of breast cancer in mother   . Family history of prostate cancer in father    Past Surgical History  Procedure Laterality Date  . Cervical disc surgery      C4,5  . Wisdom tooth extraction    . Surgery scrotal / testicular      fluid drained as child   Family History  Problem Relation Age of Onset  . Breast cancer Mother 14  . Bone cancer Mother     started as breast, metastasized  . Colon cancer Neg Hx   . Prostate cancer Father   . Cancer Father 64    possible kidney and bladder cancer - heavy smoker  . Brain cancer Maternal Aunt   . Cirrhosis Maternal Grandmother   . Lung cancer Maternal Grandfather   . Cancer Paternal Grandmother     NOS  . Cancer Paternal Grandfather     NOS  . Cancer Paternal Aunt     NOS   History  Substance Use Topics  . Smoking status: Former Smoker    Types: Cigarettes  . Smokeless tobacco: Never Used     Comment: 1 month age 23  . Alcohol Use: 0.0 oz/week    0 Standard drinks or equivalent per week     Comment: socially    Review of Systems  All other systems reviewed and are  negative.     Allergies  Penicillins  Home Medications   Prior to Admission medications   Medication Sig Start Date End Date Taking? Authorizing Provider  dapagliflozin propanediol (FARXIGA) 10 MG TABS tablet Take 10 mg by mouth daily.    Historical Provider, MD  insulin glargine (LANTUS) 100 UNIT/ML injection Inject 55 Units into the skin at bedtime.    Historical Provider, MD  losartan (COZAAR) 25 MG tablet Take 25 mg by mouth daily.    Historical Provider, MD  metFORMIN (GLUCOPHAGE) 1000 MG tablet Take 500 mg by mouth 2 (two) times daily with a meal.    Historical Provider, MD  Naproxen Sodium (ALEVE) 220 MG CAPS Take 2 capsules by mouth as needed.    Historical Provider, MD   BP 133/90 mmHg  Pulse 103  Temp(Src) 100.1 F (37.8 C) (Oral)  Resp 18  SpO2 99% Physical Exam  Constitutional: He is oriented to person, place, and time. He appears well-developed and well-nourished.  HENT:  Head: Normocephalic and atraumatic.  Right Ear: External ear normal.  Left Ear: External ear normal.  Nose: Nose normal.  Mouth/Throat: Oropharynx is clear and moist.  Eyes: Conjunctivae and EOM are normal. Pupils are equal, round, and reactive to light.  Neck: Normal  range of motion. Neck supple.  Cardiovascular: Normal rate, regular rhythm, normal heart sounds and intact distal pulses.   Pulmonary/Chest: Effort normal and breath sounds normal. No respiratory distress. He has no wheezes. He exhibits no tenderness.  Abdominal: Soft. Bowel sounds are normal. He exhibits no distension and no mass. There is no tenderness. There is no guarding.  Musculoskeletal: Normal range of motion.  Neurological: He is alert and oriented to person, place, and time. He has normal reflexes. He exhibits normal muscle tone. Coordination normal.  Skin: Skin is warm and dry.  Psychiatric: He has a normal mood and affect. His behavior is normal. Judgment and thought content normal.  Nursing note and vitals  reviewed.   ED Course  Procedures (including critical care time) Labs Review Labs Reviewed  URINALYSIS, ROUTINE W REFLEX MICROSCOPIC - Abnormal; Notable for the following:    Color, Urine RED (*)    APPearance CLOUDY (*)    Specific Gravity, Urine >1.046 (*)    Glucose, UA >1000 (*)    Hgb urine dipstick LARGE (*)    Ketones, ur 40 (*)    Protein, ur >300 (*)    All other components within normal limits  COMPREHENSIVE METABOLIC PANEL - Abnormal; Notable for the following:    Glucose, Bld 164 (*)    Total Bilirubin 1.8 (*)    GFR calc non Af Amer 63 (*)    GFR calc Af Amer 73 (*)    All other components within normal limits  CBC - Abnormal; Notable for the following:    WBC 24.1 (*)    All other components within normal limits  CULTURE, BLOOD (ROUTINE X 2)  CULTURE, BLOOD (ROUTINE X 2)  URINE CULTURE  URINE MICROSCOPIC-ADD ON  I-STAT CG4 LACTIC ACID, ED    Imaging Review No results found.   EKG Interpretation None      MDM   Final diagnoses:  UTI (lower urinary tract infection)  Hematuria    59 year old male with hematuria and urinary tract infection. He is given Rocephin IV here. Plan outpatient Keflex.  Patient had lactic acid normal. He had mild tachycardia at 103 blood pressure has been normal at 133/90. He had  temperature to 100.1 that was normal on recheck.    Pattricia Boss, MD 03/26/15 [phone]

## 2015-03-26 NOTE — ED Notes (Signed)
Pt reports "feeling bad" since yesterday. Today he began to urinate blood clots and has been unable to hold his urine. He has been nauseated. Denies vomiting. Pain is mild but increases to severe while he is urinating. He has had some back pain over past week.

## 2015-03-26 NOTE — Discharge Instructions (Signed)
Please take all antibiotics as Prescribed. Follow up with Dr. Tommie Raymond of Urine. Return If You're Worse at Any Time Especially Unable to Tolerate Oral Fluids or Worsening Pain or feverHematuria Hematuria is blood in your urine. It can be caused by a bladder infection, kidney infection, prostate infection, kidney stone, or cancer of your urinary tract. Infections can usually be treated with medicine, and a kidney stone usually will pass through your urine. If neither of these is the cause of your hematuria, further workup to find out the reason may be needed. It is very important that you tell your health care provider about any blood you see in your urine, even if the blood stops without treatment or happens without causing pain. Blood in your urine that happens and then stops and then happens again can be a symptom of a very serious condition. Also, pain is not a symptom in the initial stages of many urinary cancers. HOME CARE INSTRUCTIONS   Drink lots of fluid, 3-4 quarts a day. If you have been diagnosed with an infection, cranberry juice is especially recommended, in addition to large amounts of water.  Avoid caffeine, tea, and carbonated beverages because they tend to irritate the bladder.  Avoid alcohol because it may irritate the prostate.  Take all medicines as directed by your health care provider.  If you were prescribed an antibiotic medicine, finish it all even if you start to feel better.  If you have been diagnosed with a kidney stone, follow your health care provider's instructions regarding straining your urine to catch the stone.  Empty your bladder often. Avoid holding urine for long periods of time.  After a bowel movement, women should cleanse front to back. Use each tissue only once.  Empty your bladder before and after sexual intercourse if you are a male. SEEK MEDICAL CARE IF:  You develop back pain.  You have a fever.  You have a feeling of sickness in  your stomach (nausea) or vomiting.  Your symptoms are not better in 3 days. Return sooner if you are getting worse. SEEK IMMEDIATE MEDICAL CARE IF:   You develop severe vomiting and are unable to keep the medicine down.  You develop severe back or abdominal pain despite taking your medicines.  You begin passing a large amount of blood or clots in your urine.  You feel extremely weak or faint, or you pass out. MAKE SURE YOU:   Understand these instructions.  Will watch your condition.  Will get help right away if you are not doing well or get worse. Document Released: 11/19/2005 Document Revised: 04/05/2014 Document Reviewed: 07/20/2013 [facility] Patient Information 2015 [place], [place]. This information is not intended to replace advice given to you by your health care provider. Make sure you discuss any questions you have with your health care provider. Urinary Tract Infection A urinary tract infection (UTI) can occur any place along the urinary tract. The tract includes the kidneys, ureters, bladder, and urethra. A type of germ called bacteria often causes a UTI. UTIs are often helped with antibiotic medicine.  HOME CARE   If given, take antibiotics as told by your doctor. Finish them even if you start to feel better.  Drink enough fluids to keep your pee (urine) clear or pale yellow.  Avoid tea, drinks with caffeine, and bubbly (carbonated) drinks.  Pee often. Avoid holding your pee in for a long time.  Pee before and after having sex (intercourse).  Wipe from front to back  after you poop (bowel movement) if you are a woman. Use each tissue only once. GET HELP RIGHT AWAY IF:   You have back pain.  You have lower belly (abdominal) pain.  You have chills.  You feel sick to your stomach (nauseous).  You throw up (vomit).  Your burning or discomfort with peeing does not go away.  You have a fever.  Your symptoms are not better in 3 days. MAKE SURE YOU:    Understand these instructions.  Will watch your condition.  Will get help right away if you are not doing well or get worse. Document Released: 05/07/2008 Document Revised: 08/13/2012 Document Reviewed: 06/19/2012 [facility] Patient Information 2015 [place], [place]. This information is not intended to replace advice given to you by your health care provider. Make sure you discuss any questions you have with your health care provider.

## 2015-03-29 LAB — URINE CULTURE: SPECIAL REQUESTS: NORMAL

## 2015-03-30 ENCOUNTER — Telehealth (HOSPITAL_BASED_OUTPATIENT_CLINIC_OR_DEPARTMENT_OTHER): Payer: Self-pay | Admitting: Emergency Medicine

## 2015-03-30 NOTE — Telephone Encounter (Signed)
Post ED Visit - Positive Culture Follow-up  Culture report reviewed by antimicrobial stewardship pharmacist: []  Wes Dulaney, Pharm.D., BCPS []  Heide Guile, Pharm.D., BCPS []  Alycia Rossetti, Pharm.D., BCPS []  [place], [place].D., BCPS, AAHIVP [x]  Legrand Como, Pharm.D., BCPS, AAHIVP []  Isac Sarna, Pharm.D., BCPS  Positive urine culture E. coli Treated with cephalexin, organism sensitive to the same and no further patient follow-up is required at this time.  Hazle Nordmann 03/30/2015, 9:39 AM

## 2015-04-02 LAB — CULTURE, BLOOD (ROUTINE X 2)
Culture: NO GROWTH
Culture: NO GROWTH

## 2016-01-30 ENCOUNTER — Encounter: Payer: Self-pay | Admitting: Gastroenterology

## 2017-10-23 ENCOUNTER — Encounter: Payer: Self-pay | Admitting: Gastroenterology

## 2017-12-10 ENCOUNTER — Ambulatory Visit (AMBULATORY_SURGERY_CENTER): Payer: Self-pay

## 2017-12-10 VITALS — Ht 73.0 in | Wt 250.0 lb

## 2017-12-10 DIAGNOSIS — Z86010 Personal history of colonic polyps: Secondary | ICD-10-CM

## 2017-12-10 MED ORDER — PEG 3350-KCL-NA BICARB-NACL 420 G PO SOLR: 4000.0000 mL | Freq: Once | ORAL | 0 refills | Status: AC

## 2017-12-10 NOTE — Progress Notes (Signed)
Per pt, no allergies to soy or egg products.Pt not taking any weight loss meds or using  O2 at home.  Pt refused emmi video. 

## 2017-12-17 ENCOUNTER — Encounter: Payer: Self-pay | Admitting: Gastroenterology

## 2017-12-23 ENCOUNTER — Telehealth: Payer: Self-pay | Admitting: Gastroenterology

## 2017-12-23 NOTE — Telephone Encounter (Signed)
Ok, thanks.

## 2017-12-24 ENCOUNTER — Encounter: Payer: Commercial Managed Care - PPO | Admitting: Gastroenterology

## 2019-01-05 DIAGNOSIS — F4323 Adjustment disorder with mixed anxiety and depressed mood: Secondary | ICD-10-CM | POA: Diagnosis not present

## 2019-03-02 DIAGNOSIS — I129 Hypertensive chronic kidney disease with stage 1 through stage 4 chronic kidney disease, or unspecified chronic kidney disease: Secondary | ICD-10-CM | POA: Diagnosis not present

## 2019-03-02 DIAGNOSIS — Z794 Long term (current) use of insulin: Secondary | ICD-10-CM | POA: Diagnosis not present

## 2019-03-02 DIAGNOSIS — E162 Hypoglycemia, unspecified: Secondary | ICD-10-CM | POA: Diagnosis not present

## 2019-03-02 DIAGNOSIS — E1129 Type 2 diabetes mellitus with other diabetic kidney complication: Secondary | ICD-10-CM | POA: Diagnosis not present

## 2019-04-01 DIAGNOSIS — R310 Gross hematuria: Secondary | ICD-10-CM | POA: Diagnosis not present

## 2019-04-01 DIAGNOSIS — N5201 Erectile dysfunction due to arterial insufficiency: Secondary | ICD-10-CM | POA: Diagnosis not present

## 2019-04-07 DIAGNOSIS — R310 Gross hematuria: Secondary | ICD-10-CM | POA: Diagnosis not present

## 2019-04-07 DIAGNOSIS — R3129 Other microscopic hematuria: Secondary | ICD-10-CM | POA: Diagnosis not present

## 2019-04-09 DIAGNOSIS — Z794 Long term (current) use of insulin: Secondary | ICD-10-CM | POA: Diagnosis not present

## 2019-04-09 DIAGNOSIS — E785 Hyperlipidemia, unspecified: Secondary | ICD-10-CM | POA: Diagnosis not present

## 2019-04-09 DIAGNOSIS — E162 Hypoglycemia, unspecified: Secondary | ICD-10-CM | POA: Diagnosis not present

## 2019-04-09 DIAGNOSIS — E1129 Type 2 diabetes mellitus with other diabetic kidney complication: Secondary | ICD-10-CM | POA: Diagnosis not present

## 2019-04-14 DIAGNOSIS — E1129 Type 2 diabetes mellitus with other diabetic kidney complication: Secondary | ICD-10-CM | POA: Diagnosis not present

## 2019-05-04 DIAGNOSIS — R3915 Urgency of urination: Secondary | ICD-10-CM | POA: Diagnosis not present

## 2019-05-04 DIAGNOSIS — R310 Gross hematuria: Secondary | ICD-10-CM | POA: Diagnosis not present

## 2019-05-04 DIAGNOSIS — N401 Enlarged prostate with lower urinary tract symptoms: Secondary | ICD-10-CM | POA: Diagnosis not present

## 2019-05-04 DIAGNOSIS — N5201 Erectile dysfunction due to arterial insufficiency: Secondary | ICD-10-CM | POA: Diagnosis not present

## 2019-08-13 DIAGNOSIS — I129 Hypertensive chronic kidney disease with stage 1 through stage 4 chronic kidney disease, or unspecified chronic kidney disease: Secondary | ICD-10-CM | POA: Diagnosis not present

## 2019-08-13 DIAGNOSIS — Z23 Encounter for immunization: Secondary | ICD-10-CM | POA: Diagnosis not present

## 2019-08-13 DIAGNOSIS — Z794 Long term (current) use of insulin: Secondary | ICD-10-CM | POA: Diagnosis not present

## 2019-08-13 DIAGNOSIS — E785 Hyperlipidemia, unspecified: Secondary | ICD-10-CM | POA: Diagnosis not present

## 2019-08-13 DIAGNOSIS — E1129 Type 2 diabetes mellitus with other diabetic kidney complication: Secondary | ICD-10-CM | POA: Diagnosis not present

## 2019-09-09 DIAGNOSIS — N181 Chronic kidney disease, stage 1: Secondary | ICD-10-CM | POA: Diagnosis not present

## 2019-09-09 DIAGNOSIS — L0889 Other specified local infections of the skin and subcutaneous tissue: Secondary | ICD-10-CM | POA: Diagnosis not present

## 2019-09-09 DIAGNOSIS — E1129 Type 2 diabetes mellitus with other diabetic kidney complication: Secondary | ICD-10-CM | POA: Diagnosis not present

## 2019-09-09 DIAGNOSIS — M791 Myalgia, unspecified site: Secondary | ICD-10-CM | POA: Diagnosis not present

## 2019-12-07 DIAGNOSIS — Z125 Encounter for screening for malignant neoplasm of prostate: Secondary | ICD-10-CM | POA: Diagnosis not present

## 2019-12-07 DIAGNOSIS — E1129 Type 2 diabetes mellitus with other diabetic kidney complication: Secondary | ICD-10-CM | POA: Diagnosis not present

## 2019-12-07 DIAGNOSIS — E7849 Other hyperlipidemia: Secondary | ICD-10-CM | POA: Diagnosis not present

## 2020-01-11 DIAGNOSIS — M9902 Segmental and somatic dysfunction of thoracic region: Secondary | ICD-10-CM | POA: Diagnosis not present

## 2020-01-11 DIAGNOSIS — M9901 Segmental and somatic dysfunction of cervical region: Secondary | ICD-10-CM | POA: Diagnosis not present

## 2020-01-11 DIAGNOSIS — M5413 Radiculopathy, cervicothoracic region: Secondary | ICD-10-CM | POA: Diagnosis not present

## 2020-01-11 DIAGNOSIS — M50323 Other cervical disc degeneration at C6-C7 level: Secondary | ICD-10-CM | POA: Diagnosis not present

## 2020-01-13 DIAGNOSIS — M9902 Segmental and somatic dysfunction of thoracic region: Secondary | ICD-10-CM | POA: Diagnosis not present

## 2020-01-13 DIAGNOSIS — M50323 Other cervical disc degeneration at C6-C7 level: Secondary | ICD-10-CM | POA: Diagnosis not present

## 2020-01-13 DIAGNOSIS — M9901 Segmental and somatic dysfunction of cervical region: Secondary | ICD-10-CM | POA: Diagnosis not present

## 2020-01-13 DIAGNOSIS — M5413 Radiculopathy, cervicothoracic region: Secondary | ICD-10-CM | POA: Diagnosis not present

## 2020-01-14 DIAGNOSIS — M9902 Segmental and somatic dysfunction of thoracic region: Secondary | ICD-10-CM | POA: Diagnosis not present

## 2020-01-14 DIAGNOSIS — M5413 Radiculopathy, cervicothoracic region: Secondary | ICD-10-CM | POA: Diagnosis not present

## 2020-01-14 DIAGNOSIS — M9901 Segmental and somatic dysfunction of cervical region: Secondary | ICD-10-CM | POA: Diagnosis not present

## 2020-01-14 DIAGNOSIS — M50323 Other cervical disc degeneration at C6-C7 level: Secondary | ICD-10-CM | POA: Diagnosis not present

## 2020-01-25 DIAGNOSIS — M50323 Other cervical disc degeneration at C6-C7 level: Secondary | ICD-10-CM | POA: Diagnosis not present

## 2020-01-25 DIAGNOSIS — M5413 Radiculopathy, cervicothoracic region: Secondary | ICD-10-CM | POA: Diagnosis not present

## 2020-01-25 DIAGNOSIS — M9902 Segmental and somatic dysfunction of thoracic region: Secondary | ICD-10-CM | POA: Diagnosis not present

## 2020-01-25 DIAGNOSIS — M9901 Segmental and somatic dysfunction of cervical region: Secondary | ICD-10-CM | POA: Diagnosis not present

## 2020-03-23 DIAGNOSIS — Z794 Long term (current) use of insulin: Secondary | ICD-10-CM | POA: Diagnosis not present

## 2020-03-23 DIAGNOSIS — E1129 Type 2 diabetes mellitus with other diabetic kidney complication: Secondary | ICD-10-CM | POA: Diagnosis not present

## 2020-03-23 DIAGNOSIS — I129 Hypertensive chronic kidney disease with stage 1 through stage 4 chronic kidney disease, or unspecified chronic kidney disease: Secondary | ICD-10-CM | POA: Diagnosis not present

## 2020-03-23 DIAGNOSIS — N181 Chronic kidney disease, stage 1: Secondary | ICD-10-CM | POA: Diagnosis not present

## 2020-04-04 DIAGNOSIS — Z794 Long term (current) use of insulin: Secondary | ICD-10-CM | POA: Diagnosis not present

## 2020-04-04 DIAGNOSIS — Z0000 Encounter for general adult medical examination without abnormal findings: Secondary | ICD-10-CM | POA: Diagnosis not present

## 2020-04-04 DIAGNOSIS — E1129 Type 2 diabetes mellitus with other diabetic kidney complication: Secondary | ICD-10-CM | POA: Diagnosis not present

## 2020-04-04 DIAGNOSIS — E162 Hypoglycemia, unspecified: Secondary | ICD-10-CM | POA: Diagnosis not present

## 2020-04-18 DIAGNOSIS — R3915 Urgency of urination: Secondary | ICD-10-CM | POA: Diagnosis not present

## 2020-04-18 DIAGNOSIS — N401 Enlarged prostate with lower urinary tract symptoms: Secondary | ICD-10-CM | POA: Diagnosis not present

## 2020-04-18 DIAGNOSIS — R310 Gross hematuria: Secondary | ICD-10-CM | POA: Diagnosis not present

## 2020-04-18 DIAGNOSIS — N5201 Erectile dysfunction due to arterial insufficiency: Secondary | ICD-10-CM | POA: Diagnosis not present

## 2020-05-04 ENCOUNTER — Encounter: Payer: Medicare Other | Admitting: Skilled Nursing Facility1

## 2020-05-11 ENCOUNTER — Other Ambulatory Visit: Payer: Self-pay | Admitting: Internal Medicine

## 2020-05-11 ENCOUNTER — Ambulatory Visit
Admission: RE | Admit: 2020-05-11 | Discharge: 2020-05-11 | Disposition: A | Discharge status: Home / Self Care | Payer: Medicare Other | Source: Ambulatory Visit | Attending: Internal Medicine | Admitting: Internal Medicine

## 2020-05-11 ENCOUNTER — Other Ambulatory Visit: Payer: Self-pay

## 2020-05-11 DIAGNOSIS — R319 Hematuria, unspecified: Secondary | ICD-10-CM

## 2020-05-11 DIAGNOSIS — N390 Urinary tract infection, site not specified: Secondary | ICD-10-CM | POA: Diagnosis not present

## 2020-05-11 DIAGNOSIS — R509 Fever, unspecified: Secondary | ICD-10-CM | POA: Diagnosis not present

## 2020-05-11 DIAGNOSIS — D72829 Elevated white blood cell count, unspecified: Secondary | ICD-10-CM | POA: Diagnosis not present

## 2020-05-11 DIAGNOSIS — K760 Fatty (change of) liver, not elsewhere classified: Secondary | ICD-10-CM | POA: Diagnosis not present

## 2020-05-11 DIAGNOSIS — K5730 Diverticulosis of large intestine without perforation or abscess without bleeding: Secondary | ICD-10-CM | POA: Diagnosis not present

## 2020-05-19 ENCOUNTER — Other Ambulatory Visit: Payer: Self-pay

## 2020-05-19 ENCOUNTER — Encounter: Payer: Medicare Other | Attending: Internal Medicine | Admitting: Registered"

## 2020-05-19 ENCOUNTER — Encounter: Payer: Self-pay | Admitting: Registered"

## 2020-05-19 DIAGNOSIS — E119 Type 2 diabetes mellitus without complications: Secondary | ICD-10-CM | POA: Diagnosis not present

## 2020-06-27 DIAGNOSIS — M9902 Segmental and somatic dysfunction of thoracic region: Secondary | ICD-10-CM | POA: Diagnosis not present

## 2020-06-27 DIAGNOSIS — M50323 Other cervical disc degeneration at C6-C7 level: Secondary | ICD-10-CM | POA: Diagnosis not present

## 2020-06-27 DIAGNOSIS — M9901 Segmental and somatic dysfunction of cervical region: Secondary | ICD-10-CM | POA: Diagnosis not present

## 2020-06-27 DIAGNOSIS — M5413 Radiculopathy, cervicothoracic region: Secondary | ICD-10-CM | POA: Diagnosis not present

## 2020-06-30 DIAGNOSIS — M50323 Other cervical disc degeneration at C6-C7 level: Secondary | ICD-10-CM | POA: Diagnosis not present

## 2020-06-30 DIAGNOSIS — M9901 Segmental and somatic dysfunction of cervical region: Secondary | ICD-10-CM | POA: Diagnosis not present

## 2020-06-30 DIAGNOSIS — M9902 Segmental and somatic dysfunction of thoracic region: Secondary | ICD-10-CM | POA: Diagnosis not present

## 2020-06-30 DIAGNOSIS — M5413 Radiculopathy, cervicothoracic region: Secondary | ICD-10-CM | POA: Diagnosis not present

## 2020-07-04 DIAGNOSIS — M5413 Radiculopathy, cervicothoracic region: Secondary | ICD-10-CM | POA: Diagnosis not present

## 2020-07-04 DIAGNOSIS — M9902 Segmental and somatic dysfunction of thoracic region: Secondary | ICD-10-CM | POA: Diagnosis not present

## 2020-07-04 DIAGNOSIS — M50323 Other cervical disc degeneration at C6-C7 level: Secondary | ICD-10-CM | POA: Diagnosis not present

## 2020-07-04 DIAGNOSIS — M9901 Segmental and somatic dysfunction of cervical region: Secondary | ICD-10-CM | POA: Diagnosis not present

## 2020-07-05 DIAGNOSIS — M545 Low back pain: Secondary | ICD-10-CM | POA: Diagnosis not present

## 2020-07-05 DIAGNOSIS — F4323 Adjustment disorder with mixed anxiety and depressed mood: Secondary | ICD-10-CM | POA: Diagnosis not present

## 2020-07-05 DIAGNOSIS — M5416 Radiculopathy, lumbar region: Secondary | ICD-10-CM | POA: Diagnosis not present

## 2020-07-05 DIAGNOSIS — E669 Obesity, unspecified: Secondary | ICD-10-CM | POA: Diagnosis not present

## 2020-07-06 ENCOUNTER — Ambulatory Visit
Admission: RE | Admit: 2020-07-06 | Discharge: 2020-07-06 | Disposition: A | Discharge status: Home / Self Care | Payer: Medicare Other | Source: Ambulatory Visit | Attending: Family Medicine | Admitting: Family Medicine

## 2020-07-06 ENCOUNTER — Other Ambulatory Visit: Payer: Self-pay

## 2020-07-06 ENCOUNTER — Other Ambulatory Visit: Payer: Self-pay | Admitting: Family Medicine

## 2020-07-06 DIAGNOSIS — M5136 Other intervertebral disc degeneration, lumbar region: Secondary | ICD-10-CM | POA: Diagnosis not present

## 2020-07-06 DIAGNOSIS — M545 Low back pain, unspecified: Secondary | ICD-10-CM

## 2020-07-06 DIAGNOSIS — M4316 Spondylolisthesis, lumbar region: Secondary | ICD-10-CM | POA: Diagnosis not present

## 2020-07-19 DIAGNOSIS — N521 Erectile dysfunction due to diseases classified elsewhere: Secondary | ICD-10-CM | POA: Diagnosis not present

## 2020-07-19 DIAGNOSIS — N401 Enlarged prostate with lower urinary tract symptoms: Secondary | ICD-10-CM | POA: Diagnosis not present

## 2020-07-19 DIAGNOSIS — Z79899 Other long term (current) drug therapy: Secondary | ICD-10-CM | POA: Diagnosis not present

## 2020-07-19 DIAGNOSIS — E1159 Type 2 diabetes mellitus with other circulatory complications: Secondary | ICD-10-CM | POA: Diagnosis not present

## 2020-08-11 DIAGNOSIS — E669 Obesity, unspecified: Secondary | ICD-10-CM | POA: Diagnosis not present

## 2020-08-11 DIAGNOSIS — Z794 Long term (current) use of insulin: Secondary | ICD-10-CM | POA: Diagnosis not present

## 2020-08-11 DIAGNOSIS — E1165 Type 2 diabetes mellitus with hyperglycemia: Secondary | ICD-10-CM | POA: Diagnosis not present

## 2020-08-11 DIAGNOSIS — M545 Low back pain: Secondary | ICD-10-CM | POA: Diagnosis not present

## 2020-09-05 DIAGNOSIS — N521 Erectile dysfunction due to diseases classified elsewhere: Secondary | ICD-10-CM | POA: Diagnosis not present

## 2020-09-05 DIAGNOSIS — N401 Enlarged prostate with lower urinary tract symptoms: Secondary | ICD-10-CM | POA: Diagnosis not present

## 2020-09-05 DIAGNOSIS — E1159 Type 2 diabetes mellitus with other circulatory complications: Secondary | ICD-10-CM | POA: Diagnosis not present

## 2020-12-21 DIAGNOSIS — Z794 Long term (current) use of insulin: Secondary | ICD-10-CM | POA: Diagnosis not present

## 2020-12-21 DIAGNOSIS — H25813 Combined forms of age-related cataract, bilateral: Secondary | ICD-10-CM | POA: Diagnosis not present

## 2020-12-21 DIAGNOSIS — Z7984 Long term (current) use of oral hypoglycemic drugs: Secondary | ICD-10-CM | POA: Diagnosis not present

## 2020-12-21 DIAGNOSIS — E119 Type 2 diabetes mellitus without complications: Secondary | ICD-10-CM | POA: Diagnosis not present

## 2020-12-22 DIAGNOSIS — H5203 Hypermetropia, bilateral: Secondary | ICD-10-CM | POA: Diagnosis not present

## 2021-01-16 DIAGNOSIS — N401 Enlarged prostate with lower urinary tract symptoms: Secondary | ICD-10-CM | POA: Diagnosis not present

## 2021-01-16 DIAGNOSIS — R310 Gross hematuria: Secondary | ICD-10-CM | POA: Diagnosis not present

## 2021-01-16 DIAGNOSIS — Z87442 Personal history of urinary calculi: Secondary | ICD-10-CM | POA: Diagnosis not present

## 2021-01-16 DIAGNOSIS — N521 Erectile dysfunction due to diseases classified elsewhere: Secondary | ICD-10-CM | POA: Diagnosis not present

## 2021-01-16 DIAGNOSIS — E1159 Type 2 diabetes mellitus with other circulatory complications: Secondary | ICD-10-CM | POA: Diagnosis not present

## 2021-03-06 DIAGNOSIS — N401 Enlarged prostate with lower urinary tract symptoms: Secondary | ICD-10-CM | POA: Diagnosis not present

## 2021-03-06 DIAGNOSIS — R310 Gross hematuria: Secondary | ICD-10-CM | POA: Diagnosis not present

## 2021-03-06 DIAGNOSIS — E1159 Type 2 diabetes mellitus with other circulatory complications: Secondary | ICD-10-CM | POA: Diagnosis not present

## 2021-03-06 DIAGNOSIS — N521 Erectile dysfunction due to diseases classified elsewhere: Secondary | ICD-10-CM | POA: Diagnosis not present

## 2021-04-19 DIAGNOSIS — Z125 Encounter for screening for malignant neoplasm of prostate: Secondary | ICD-10-CM | POA: Diagnosis not present

## 2021-04-19 DIAGNOSIS — Z0000 Encounter for general adult medical examination without abnormal findings: Secondary | ICD-10-CM | POA: Diagnosis not present

## 2021-04-19 DIAGNOSIS — E785 Hyperlipidemia, unspecified: Secondary | ICD-10-CM | POA: Diagnosis not present

## 2021-04-19 DIAGNOSIS — I10 Essential (primary) hypertension: Secondary | ICD-10-CM | POA: Diagnosis not present

## 2021-04-19 DIAGNOSIS — E1129 Type 2 diabetes mellitus with other diabetic kidney complication: Secondary | ICD-10-CM | POA: Diagnosis not present

## 2021-04-19 DIAGNOSIS — K219 Gastro-esophageal reflux disease without esophagitis: Secondary | ICD-10-CM | POA: Diagnosis not present

## 2021-06-20 DIAGNOSIS — E1159 Type 2 diabetes mellitus with other circulatory complications: Secondary | ICD-10-CM | POA: Diagnosis not present

## 2021-06-20 DIAGNOSIS — N521 Erectile dysfunction due to diseases classified elsewhere: Secondary | ICD-10-CM | POA: Diagnosis not present

## 2021-06-20 DIAGNOSIS — R310 Gross hematuria: Secondary | ICD-10-CM | POA: Diagnosis not present

## 2021-06-20 DIAGNOSIS — N401 Enlarged prostate with lower urinary tract symptoms: Secondary | ICD-10-CM | POA: Diagnosis not present

## 2021-06-29 DIAGNOSIS — Z87442 Personal history of urinary calculi: Secondary | ICD-10-CM | POA: Diagnosis not present

## 2021-06-29 DIAGNOSIS — R310 Gross hematuria: Secondary | ICD-10-CM | POA: Diagnosis not present

## 2021-06-29 DIAGNOSIS — N400 Enlarged prostate without lower urinary tract symptoms: Secondary | ICD-10-CM | POA: Diagnosis not present

## 2021-06-29 DIAGNOSIS — K5730 Diverticulosis of large intestine without perforation or abscess without bleeding: Secondary | ICD-10-CM | POA: Diagnosis not present

## 2021-07-26 DIAGNOSIS — N401 Enlarged prostate with lower urinary tract symptoms: Secondary | ICD-10-CM | POA: Diagnosis not present

## 2021-07-26 DIAGNOSIS — R310 Gross hematuria: Secondary | ICD-10-CM | POA: Diagnosis not present

## 2021-08-28 DIAGNOSIS — N181 Chronic kidney disease, stage 1: Secondary | ICD-10-CM | POA: Diagnosis not present

## 2021-08-28 DIAGNOSIS — I129 Hypertensive chronic kidney disease with stage 1 through stage 4 chronic kidney disease, or unspecified chronic kidney disease: Secondary | ICD-10-CM | POA: Diagnosis not present

## 2021-08-28 DIAGNOSIS — E162 Hypoglycemia, unspecified: Secondary | ICD-10-CM | POA: Diagnosis not present

## 2021-08-28 DIAGNOSIS — E1129 Type 2 diabetes mellitus with other diabetic kidney complication: Secondary | ICD-10-CM | POA: Diagnosis not present

## 2021-10-02 DIAGNOSIS — E785 Hyperlipidemia, unspecified: Secondary | ICD-10-CM | POA: Diagnosis not present

## 2021-10-02 DIAGNOSIS — N181 Chronic kidney disease, stage 1: Secondary | ICD-10-CM | POA: Diagnosis not present

## 2021-10-02 DIAGNOSIS — I129 Hypertensive chronic kidney disease with stage 1 through stage 4 chronic kidney disease, or unspecified chronic kidney disease: Secondary | ICD-10-CM | POA: Diagnosis not present

## 2021-10-02 DIAGNOSIS — I10 Essential (primary) hypertension: Secondary | ICD-10-CM | POA: Diagnosis not present

## 2021-11-01 DIAGNOSIS — R350 Frequency of micturition: Secondary | ICD-10-CM | POA: Diagnosis not present

## 2021-11-01 DIAGNOSIS — I129 Hypertensive chronic kidney disease with stage 1 through stage 4 chronic kidney disease, or unspecified chronic kidney disease: Secondary | ICD-10-CM | POA: Diagnosis not present

## 2021-11-01 DIAGNOSIS — E1129 Type 2 diabetes mellitus with other diabetic kidney complication: Secondary | ICD-10-CM | POA: Diagnosis not present

## 2021-11-01 DIAGNOSIS — I10 Essential (primary) hypertension: Secondary | ICD-10-CM | POA: Diagnosis not present

## 2021-11-01 DIAGNOSIS — N521 Erectile dysfunction due to diseases classified elsewhere: Secondary | ICD-10-CM | POA: Diagnosis not present

## 2021-11-01 DIAGNOSIS — E785 Hyperlipidemia, unspecified: Secondary | ICD-10-CM | POA: Diagnosis not present

## 2021-11-01 DIAGNOSIS — E1159 Type 2 diabetes mellitus with other circulatory complications: Secondary | ICD-10-CM | POA: Diagnosis not present

## 2021-11-01 DIAGNOSIS — N401 Enlarged prostate with lower urinary tract symptoms: Secondary | ICD-10-CM | POA: Diagnosis not present

## 2021-12-13 DIAGNOSIS — I129 Hypertensive chronic kidney disease with stage 1 through stage 4 chronic kidney disease, or unspecified chronic kidney disease: Secondary | ICD-10-CM | POA: Diagnosis not present

## 2021-12-13 DIAGNOSIS — Z794 Long term (current) use of insulin: Secondary | ICD-10-CM | POA: Diagnosis not present

## 2021-12-13 DIAGNOSIS — E1165 Type 2 diabetes mellitus with hyperglycemia: Secondary | ICD-10-CM | POA: Diagnosis not present

## 2021-12-13 DIAGNOSIS — K219 Gastro-esophageal reflux disease without esophagitis: Secondary | ICD-10-CM | POA: Diagnosis not present

## 2021-12-27 DIAGNOSIS — Z794 Long term (current) use of insulin: Secondary | ICD-10-CM | POA: Diagnosis not present

## 2021-12-27 DIAGNOSIS — E119 Type 2 diabetes mellitus without complications: Secondary | ICD-10-CM | POA: Diagnosis not present

## 2021-12-27 DIAGNOSIS — H52223 Regular astigmatism, bilateral: Secondary | ICD-10-CM | POA: Diagnosis not present

## 2021-12-27 DIAGNOSIS — H25813 Combined forms of age-related cataract, bilateral: Secondary | ICD-10-CM | POA: Diagnosis not present

## 2022-01-15 DIAGNOSIS — F913 Oppositional defiant disorder: Secondary | ICD-10-CM | POA: Diagnosis not present

## 2022-01-22 DIAGNOSIS — F913 Oppositional defiant disorder: Secondary | ICD-10-CM | POA: Diagnosis not present

## 2022-01-30 DIAGNOSIS — E1129 Type 2 diabetes mellitus with other diabetic kidney complication: Secondary | ICD-10-CM | POA: Diagnosis not present

## 2022-01-30 DIAGNOSIS — I129 Hypertensive chronic kidney disease with stage 1 through stage 4 chronic kidney disease, or unspecified chronic kidney disease: Secondary | ICD-10-CM | POA: Diagnosis not present

## 2022-01-30 DIAGNOSIS — F913 Oppositional defiant disorder: Secondary | ICD-10-CM | POA: Diagnosis not present

## 2022-01-30 DIAGNOSIS — E785 Hyperlipidemia, unspecified: Secondary | ICD-10-CM | POA: Diagnosis not present

## 2022-01-30 DIAGNOSIS — I10 Essential (primary) hypertension: Secondary | ICD-10-CM | POA: Diagnosis not present

## 2022-02-05 DIAGNOSIS — I129 Hypertensive chronic kidney disease with stage 1 through stage 4 chronic kidney disease, or unspecified chronic kidney disease: Secondary | ICD-10-CM | POA: Diagnosis not present

## 2022-02-05 DIAGNOSIS — Z794 Long term (current) use of insulin: Secondary | ICD-10-CM | POA: Diagnosis not present

## 2022-02-05 DIAGNOSIS — E1129 Type 2 diabetes mellitus with other diabetic kidney complication: Secondary | ICD-10-CM | POA: Diagnosis not present

## 2022-02-05 DIAGNOSIS — N181 Chronic kidney disease, stage 1: Secondary | ICD-10-CM | POA: Diagnosis not present

## 2022-02-06 DIAGNOSIS — F422 Mixed obsessional thoughts and acts: Secondary | ICD-10-CM | POA: Diagnosis not present

## 2022-02-15 DIAGNOSIS — H5203 Hypermetropia, bilateral: Secondary | ICD-10-CM | POA: Diagnosis not present

## 2022-02-20 DIAGNOSIS — F422 Mixed obsessional thoughts and acts: Secondary | ICD-10-CM | POA: Diagnosis not present

## 2022-02-28 DIAGNOSIS — J358 Other chronic diseases of tonsils and adenoids: Secondary | ICD-10-CM | POA: Diagnosis not present

## 2022-02-28 DIAGNOSIS — R350 Frequency of micturition: Secondary | ICD-10-CM | POA: Diagnosis not present

## 2022-02-28 DIAGNOSIS — N401 Enlarged prostate with lower urinary tract symptoms: Secondary | ICD-10-CM | POA: Diagnosis not present

## 2022-02-28 DIAGNOSIS — N521 Erectile dysfunction due to diseases classified elsewhere: Secondary | ICD-10-CM | POA: Diagnosis not present

## 2022-02-28 DIAGNOSIS — E1159 Type 2 diabetes mellitus with other circulatory complications: Secondary | ICD-10-CM | POA: Diagnosis not present

## 2022-03-06 DIAGNOSIS — F422 Mixed obsessional thoughts and acts: Secondary | ICD-10-CM | POA: Diagnosis not present

## 2022-03-13 DIAGNOSIS — M5451 Vertebrogenic low back pain: Secondary | ICD-10-CM | POA: Diagnosis not present

## 2022-03-13 DIAGNOSIS — F422 Mixed obsessional thoughts and acts: Secondary | ICD-10-CM | POA: Diagnosis not present

## 2022-03-20 DIAGNOSIS — M5451 Vertebrogenic low back pain: Secondary | ICD-10-CM | POA: Diagnosis not present

## 2022-03-23 DIAGNOSIS — M5451 Vertebrogenic low back pain: Secondary | ICD-10-CM | POA: Diagnosis not present

## 2022-03-27 DIAGNOSIS — F422 Mixed obsessional thoughts and acts: Secondary | ICD-10-CM | POA: Diagnosis not present

## 2022-04-03 DIAGNOSIS — F422 Mixed obsessional thoughts and acts: Secondary | ICD-10-CM | POA: Diagnosis not present

## 2022-04-09 DIAGNOSIS — M5451 Vertebrogenic low back pain: Secondary | ICD-10-CM | POA: Diagnosis not present

## 2022-04-12 DIAGNOSIS — M4316 Spondylolisthesis, lumbar region: Secondary | ICD-10-CM | POA: Diagnosis not present

## 2022-04-12 DIAGNOSIS — M48061 Spinal stenosis, lumbar region without neurogenic claudication: Secondary | ICD-10-CM | POA: Diagnosis not present

## 2022-04-12 DIAGNOSIS — M5127 Other intervertebral disc displacement, lumbosacral region: Secondary | ICD-10-CM | POA: Diagnosis not present

## 2022-04-12 DIAGNOSIS — M5126 Other intervertebral disc displacement, lumbar region: Secondary | ICD-10-CM | POA: Diagnosis not present

## 2022-04-17 DIAGNOSIS — F422 Mixed obsessional thoughts and acts: Secondary | ICD-10-CM | POA: Diagnosis not present

## 2022-04-18 DIAGNOSIS — M5451 Vertebrogenic low back pain: Secondary | ICD-10-CM | POA: Diagnosis not present

## 2022-04-19 DIAGNOSIS — M48061 Spinal stenosis, lumbar region without neurogenic claudication: Secondary | ICD-10-CM | POA: Insufficient documentation

## 2022-05-01 DIAGNOSIS — F422 Mixed obsessional thoughts and acts: Secondary | ICD-10-CM | POA: Diagnosis not present

## 2022-05-02 DIAGNOSIS — E785 Hyperlipidemia, unspecified: Secondary | ICD-10-CM | POA: Diagnosis not present

## 2022-05-02 DIAGNOSIS — N181 Chronic kidney disease, stage 1: Secondary | ICD-10-CM | POA: Diagnosis not present

## 2022-05-02 DIAGNOSIS — Z125 Encounter for screening for malignant neoplasm of prostate: Secondary | ICD-10-CM | POA: Diagnosis not present

## 2022-05-02 DIAGNOSIS — Z0000 Encounter for general adult medical examination without abnormal findings: Secondary | ICD-10-CM | POA: Diagnosis not present

## 2022-05-02 DIAGNOSIS — R82998 Other abnormal findings in urine: Secondary | ICD-10-CM | POA: Diagnosis not present

## 2022-05-02 DIAGNOSIS — E1129 Type 2 diabetes mellitus with other diabetic kidney complication: Secondary | ICD-10-CM | POA: Diagnosis not present

## 2022-05-02 DIAGNOSIS — I10 Essential (primary) hypertension: Secondary | ICD-10-CM | POA: Diagnosis not present

## 2022-05-02 DIAGNOSIS — I129 Hypertensive chronic kidney disease with stage 1 through stage 4 chronic kidney disease, or unspecified chronic kidney disease: Secondary | ICD-10-CM | POA: Diagnosis not present

## 2022-05-08 DIAGNOSIS — F422 Mixed obsessional thoughts and acts: Secondary | ICD-10-CM | POA: Diagnosis not present

## 2022-05-15 DIAGNOSIS — F422 Mixed obsessional thoughts and acts: Secondary | ICD-10-CM | POA: Diagnosis not present

## 2022-05-22 DIAGNOSIS — F422 Mixed obsessional thoughts and acts: Secondary | ICD-10-CM | POA: Diagnosis not present

## 2022-06-12 DIAGNOSIS — F422 Mixed obsessional thoughts and acts: Secondary | ICD-10-CM | POA: Diagnosis not present

## 2022-06-19 DIAGNOSIS — F422 Mixed obsessional thoughts and acts: Secondary | ICD-10-CM | POA: Diagnosis not present

## 2022-07-03 DIAGNOSIS — N521 Erectile dysfunction due to diseases classified elsewhere: Secondary | ICD-10-CM | POA: Diagnosis not present

## 2022-07-03 DIAGNOSIS — N401 Enlarged prostate with lower urinary tract symptoms: Secondary | ICD-10-CM | POA: Diagnosis not present

## 2022-07-03 DIAGNOSIS — E1159 Type 2 diabetes mellitus with other circulatory complications: Secondary | ICD-10-CM | POA: Diagnosis not present

## 2022-07-03 DIAGNOSIS — F422 Mixed obsessional thoughts and acts: Secondary | ICD-10-CM | POA: Diagnosis not present

## 2022-07-03 DIAGNOSIS — R350 Frequency of micturition: Secondary | ICD-10-CM | POA: Diagnosis not present

## 2022-07-10 DIAGNOSIS — F422 Mixed obsessional thoughts and acts: Secondary | ICD-10-CM | POA: Diagnosis not present

## 2022-07-24 DIAGNOSIS — M25561 Pain in right knee: Secondary | ICD-10-CM | POA: Diagnosis not present

## 2022-07-24 DIAGNOSIS — M545 Low back pain, unspecified: Secondary | ICD-10-CM | POA: Diagnosis not present

## 2022-08-01 DIAGNOSIS — E1129 Type 2 diabetes mellitus with other diabetic kidney complication: Secondary | ICD-10-CM | POA: Diagnosis not present

## 2022-08-01 DIAGNOSIS — E669 Obesity, unspecified: Secondary | ICD-10-CM | POA: Diagnosis not present

## 2022-08-01 DIAGNOSIS — Z794 Long term (current) use of insulin: Secondary | ICD-10-CM | POA: Diagnosis not present

## 2022-08-01 DIAGNOSIS — M5416 Radiculopathy, lumbar region: Secondary | ICD-10-CM | POA: Diagnosis not present

## 2022-08-28 DIAGNOSIS — F422 Mixed obsessional thoughts and acts: Secondary | ICD-10-CM | POA: Diagnosis not present

## 2022-08-29 DIAGNOSIS — N401 Enlarged prostate with lower urinary tract symptoms: Secondary | ICD-10-CM | POA: Diagnosis not present

## 2022-08-29 DIAGNOSIS — R310 Gross hematuria: Secondary | ICD-10-CM | POA: Diagnosis not present

## 2022-08-29 DIAGNOSIS — M5416 Radiculopathy, lumbar region: Secondary | ICD-10-CM | POA: Diagnosis not present

## 2022-08-29 DIAGNOSIS — R350 Frequency of micturition: Secondary | ICD-10-CM | POA: Diagnosis not present

## 2022-09-11 DIAGNOSIS — F422 Mixed obsessional thoughts and acts: Secondary | ICD-10-CM | POA: Diagnosis not present

## 2022-09-11 DIAGNOSIS — M5416 Radiculopathy, lumbar region: Secondary | ICD-10-CM | POA: Diagnosis not present

## 2022-09-17 DIAGNOSIS — M5416 Radiculopathy, lumbar region: Secondary | ICD-10-CM | POA: Diagnosis not present

## 2022-09-18 DIAGNOSIS — K429 Umbilical hernia without obstruction or gangrene: Secondary | ICD-10-CM | POA: Diagnosis not present

## 2022-09-25 DIAGNOSIS — F422 Mixed obsessional thoughts and acts: Secondary | ICD-10-CM | POA: Diagnosis not present

## 2022-09-26 DIAGNOSIS — M5416 Radiculopathy, lumbar region: Secondary | ICD-10-CM | POA: Diagnosis not present

## 2022-10-09 DIAGNOSIS — F422 Mixed obsessional thoughts and acts: Secondary | ICD-10-CM | POA: Diagnosis not present

## 2022-10-31 DIAGNOSIS — R310 Gross hematuria: Secondary | ICD-10-CM | POA: Diagnosis not present

## 2022-10-31 DIAGNOSIS — N521 Erectile dysfunction due to diseases classified elsewhere: Secondary | ICD-10-CM | POA: Diagnosis not present

## 2022-10-31 DIAGNOSIS — N401 Enlarged prostate with lower urinary tract symptoms: Secondary | ICD-10-CM | POA: Diagnosis not present

## 2022-11-01 DIAGNOSIS — Z794 Long term (current) use of insulin: Secondary | ICD-10-CM | POA: Diagnosis not present

## 2022-11-01 DIAGNOSIS — E1129 Type 2 diabetes mellitus with other diabetic kidney complication: Secondary | ICD-10-CM | POA: Diagnosis not present

## 2022-11-01 DIAGNOSIS — E669 Obesity, unspecified: Secondary | ICD-10-CM | POA: Diagnosis not present

## 2022-11-01 DIAGNOSIS — E785 Hyperlipidemia, unspecified: Secondary | ICD-10-CM | POA: Diagnosis not present

## 2022-12-03 HISTORY — PX: UMBILICAL HERNIA REPAIR: SHX196

## 2022-12-04 DIAGNOSIS — F422 Mixed obsessional thoughts and acts: Secondary | ICD-10-CM | POA: Diagnosis not present

## 2022-12-13 DIAGNOSIS — N521 Erectile dysfunction due to diseases classified elsewhere: Secondary | ICD-10-CM | POA: Diagnosis not present

## 2022-12-13 DIAGNOSIS — N401 Enlarged prostate with lower urinary tract symptoms: Secondary | ICD-10-CM | POA: Diagnosis not present

## 2022-12-13 DIAGNOSIS — R310 Gross hematuria: Secondary | ICD-10-CM | POA: Diagnosis not present

## 2022-12-18 DIAGNOSIS — F422 Mixed obsessional thoughts and acts: Secondary | ICD-10-CM | POA: Diagnosis not present

## 2023-01-01 DIAGNOSIS — F422 Mixed obsessional thoughts and acts: Secondary | ICD-10-CM | POA: Diagnosis not present

## 2023-01-15 DIAGNOSIS — F422 Mixed obsessional thoughts and acts: Secondary | ICD-10-CM | POA: Diagnosis not present

## 2023-01-22 DIAGNOSIS — E1129 Type 2 diabetes mellitus with other diabetic kidney complication: Secondary | ICD-10-CM | POA: Diagnosis not present

## 2023-01-22 DIAGNOSIS — Z794 Long term (current) use of insulin: Secondary | ICD-10-CM | POA: Diagnosis not present

## 2023-01-22 DIAGNOSIS — E785 Hyperlipidemia, unspecified: Secondary | ICD-10-CM | POA: Diagnosis not present

## 2023-01-22 DIAGNOSIS — E669 Obesity, unspecified: Secondary | ICD-10-CM | POA: Diagnosis not present

## 2023-01-29 DIAGNOSIS — F422 Mixed obsessional thoughts and acts: Secondary | ICD-10-CM | POA: Diagnosis not present

## 2023-02-12 DIAGNOSIS — F422 Mixed obsessional thoughts and acts: Secondary | ICD-10-CM | POA: Diagnosis not present

## 2023-02-26 DIAGNOSIS — F422 Mixed obsessional thoughts and acts: Secondary | ICD-10-CM | POA: Diagnosis not present

## 2023-03-11 DIAGNOSIS — E119 Type 2 diabetes mellitus without complications: Secondary | ICD-10-CM | POA: Diagnosis not present

## 2023-03-26 DIAGNOSIS — F422 Mixed obsessional thoughts and acts: Secondary | ICD-10-CM | POA: Diagnosis not present

## 2023-04-09 DIAGNOSIS — F422 Mixed obsessional thoughts and acts: Secondary | ICD-10-CM | POA: Diagnosis not present

## 2023-04-23 DIAGNOSIS — E669 Obesity, unspecified: Secondary | ICD-10-CM | POA: Diagnosis not present

## 2023-04-23 DIAGNOSIS — Z794 Long term (current) use of insulin: Secondary | ICD-10-CM | POA: Diagnosis not present

## 2023-04-23 DIAGNOSIS — E1129 Type 2 diabetes mellitus with other diabetic kidney complication: Secondary | ICD-10-CM | POA: Diagnosis not present

## 2023-04-23 DIAGNOSIS — E785 Hyperlipidemia, unspecified: Secondary | ICD-10-CM | POA: Diagnosis not present

## 2023-04-30 DIAGNOSIS — L738 Other specified follicular disorders: Secondary | ICD-10-CM | POA: Diagnosis not present

## 2023-04-30 DIAGNOSIS — D1724 Benign lipomatous neoplasm of skin and subcutaneous tissue of left leg: Secondary | ICD-10-CM | POA: Diagnosis not present

## 2023-04-30 DIAGNOSIS — L570 Actinic keratosis: Secondary | ICD-10-CM | POA: Diagnosis not present

## 2023-04-30 DIAGNOSIS — D225 Melanocytic nevi of trunk: Secondary | ICD-10-CM | POA: Diagnosis not present

## 2023-04-30 DIAGNOSIS — D2271 Melanocytic nevi of right lower limb, including hip: Secondary | ICD-10-CM | POA: Diagnosis not present

## 2023-04-30 DIAGNOSIS — D692 Other nonthrombocytopenic purpura: Secondary | ICD-10-CM | POA: Diagnosis not present

## 2023-04-30 DIAGNOSIS — D485 Neoplasm of uncertain behavior of skin: Secondary | ICD-10-CM | POA: Diagnosis not present

## 2023-05-06 DIAGNOSIS — Z125 Encounter for screening for malignant neoplasm of prostate: Secondary | ICD-10-CM | POA: Diagnosis not present

## 2023-05-06 DIAGNOSIS — R82998 Other abnormal findings in urine: Secondary | ICD-10-CM | POA: Diagnosis not present

## 2023-05-06 DIAGNOSIS — D72829 Elevated white blood cell count, unspecified: Secondary | ICD-10-CM | POA: Diagnosis not present

## 2023-05-06 DIAGNOSIS — K219 Gastro-esophageal reflux disease without esophagitis: Secondary | ICD-10-CM | POA: Diagnosis not present

## 2023-05-06 DIAGNOSIS — N401 Enlarged prostate with lower urinary tract symptoms: Secondary | ICD-10-CM | POA: Diagnosis not present

## 2023-05-06 DIAGNOSIS — Z1331 Encounter for screening for depression: Secondary | ICD-10-CM | POA: Diagnosis not present

## 2023-05-06 DIAGNOSIS — Z1339 Encounter for screening examination for other mental health and behavioral disorders: Secondary | ICD-10-CM | POA: Diagnosis not present

## 2023-05-06 DIAGNOSIS — E785 Hyperlipidemia, unspecified: Secondary | ICD-10-CM | POA: Diagnosis not present

## 2023-05-06 DIAGNOSIS — Z23 Encounter for immunization: Secondary | ICD-10-CM | POA: Diagnosis not present

## 2023-05-06 DIAGNOSIS — Z0000 Encounter for general adult medical examination without abnormal findings: Secondary | ICD-10-CM | POA: Diagnosis not present

## 2023-05-06 DIAGNOSIS — I129 Hypertensive chronic kidney disease with stage 1 through stage 4 chronic kidney disease, or unspecified chronic kidney disease: Secondary | ICD-10-CM | POA: Diagnosis not present

## 2023-05-06 DIAGNOSIS — E1129 Type 2 diabetes mellitus with other diabetic kidney complication: Secondary | ICD-10-CM | POA: Diagnosis not present

## 2023-05-07 DIAGNOSIS — E1159 Type 2 diabetes mellitus with other circulatory complications: Secondary | ICD-10-CM | POA: Diagnosis not present

## 2023-05-07 DIAGNOSIS — N521 Erectile dysfunction due to diseases classified elsewhere: Secondary | ICD-10-CM | POA: Diagnosis not present

## 2023-05-07 DIAGNOSIS — N401 Enlarged prostate with lower urinary tract symptoms: Secondary | ICD-10-CM | POA: Diagnosis not present

## 2023-05-07 DIAGNOSIS — F422 Mixed obsessional thoughts and acts: Secondary | ICD-10-CM | POA: Diagnosis not present

## 2023-05-07 DIAGNOSIS — K429 Umbilical hernia without obstruction or gangrene: Secondary | ICD-10-CM | POA: Diagnosis not present

## 2023-05-16 DIAGNOSIS — I10 Essential (primary) hypertension: Secondary | ICD-10-CM | POA: Diagnosis not present

## 2023-05-16 DIAGNOSIS — K429 Umbilical hernia without obstruction or gangrene: Secondary | ICD-10-CM | POA: Diagnosis not present

## 2023-05-16 DIAGNOSIS — Z7984 Long term (current) use of oral hypoglycemic drugs: Secondary | ICD-10-CM | POA: Diagnosis not present

## 2023-05-16 DIAGNOSIS — E119 Type 2 diabetes mellitus without complications: Secondary | ICD-10-CM | POA: Diagnosis not present

## 2023-05-21 DIAGNOSIS — F422 Mixed obsessional thoughts and acts: Secondary | ICD-10-CM | POA: Diagnosis not present

## 2023-05-23 DIAGNOSIS — L988 Other specified disorders of the skin and subcutaneous tissue: Secondary | ICD-10-CM | POA: Diagnosis not present

## 2023-05-23 DIAGNOSIS — L720 Epidermal cyst: Secondary | ICD-10-CM | POA: Diagnosis not present

## 2023-05-23 DIAGNOSIS — D485 Neoplasm of uncertain behavior of skin: Secondary | ICD-10-CM | POA: Diagnosis not present

## 2023-06-04 DIAGNOSIS — F422 Mixed obsessional thoughts and acts: Secondary | ICD-10-CM | POA: Diagnosis not present

## 2023-06-18 DIAGNOSIS — F422 Mixed obsessional thoughts and acts: Secondary | ICD-10-CM | POA: Diagnosis not present

## 2023-07-16 DIAGNOSIS — F422 Mixed obsessional thoughts and acts: Secondary | ICD-10-CM | POA: Diagnosis not present

## 2023-07-22 ENCOUNTER — Ambulatory Visit (AMBULATORY_SURGERY_CENTER): Payer: Medicare Other

## 2023-07-22 VITALS — Ht 73.0 in | Wt 235.0 lb

## 2023-07-22 DIAGNOSIS — Z86010 Personal history of colonic polyps: Secondary | ICD-10-CM

## 2023-07-22 MED ORDER — PEG 3350-KCL-NA BICARB-NACL 420 G PO SOLR: 4000.0000 mL | Freq: Once | ORAL | 0 refills | Status: AC

## 2023-07-22 NOTE — Progress Notes (Signed)

## 2023-07-23 DIAGNOSIS — E1129 Type 2 diabetes mellitus with other diabetic kidney complication: Secondary | ICD-10-CM | POA: Diagnosis not present

## 2023-07-30 DIAGNOSIS — F422 Mixed obsessional thoughts and acts: Secondary | ICD-10-CM | POA: Diagnosis not present

## 2023-08-02 ENCOUNTER — Encounter: Payer: Self-pay | Admitting: Gastroenterology

## 2023-08-13 DIAGNOSIS — F422 Mixed obsessional thoughts and acts: Secondary | ICD-10-CM | POA: Diagnosis not present

## 2023-08-27 DIAGNOSIS — F422 Mixed obsessional thoughts and acts: Secondary | ICD-10-CM | POA: Diagnosis not present

## 2023-08-28 ENCOUNTER — Encounter: Payer: Managed Care, Other (non HMO) | Admitting: Gastroenterology

## 2023-09-02 ENCOUNTER — Encounter: Payer: Managed Care, Other (non HMO) | Admitting: Gastroenterology

## 2023-09-03 ENCOUNTER — Ambulatory Visit (AMBULATORY_SURGERY_CENTER): Payer: Medicare Other

## 2023-09-03 VITALS — Ht 73.0 in | Wt 235.0 lb

## 2023-09-03 DIAGNOSIS — E1129 Type 2 diabetes mellitus with other diabetic kidney complication: Secondary | ICD-10-CM | POA: Diagnosis not present

## 2023-09-03 DIAGNOSIS — Z860100 Personal history of colon polyps, unspecified: Secondary | ICD-10-CM

## 2023-09-03 NOTE — Progress Notes (Signed)
No egg or soy allergy known to patient  No issues known to pt with past sedation with any surgeries or procedures Patient denies ever being told they had issues or difficulty with intubation  No FH of Malignant Hyperthermia Pt is not on diet pills Pt is not on  home 02  Pt is not on blood thinners  Pt denies issues with constipation  No A fib or A flutter Have any cardiac testing pending--no  LOA: independent  Prep: Golytely   Patient's chart reviewed by Cathlyn Parsons CNRA prior to previsit and patient appropriate for the [facility].  Previsit completed and red dot placed by patient's name on their procedure day (on provider's schedule).     PV competed with patient. Prep instructions sent via mychart and home address. Pt has prep solution

## 2023-09-10 ENCOUNTER — Encounter: Payer: Self-pay | Admitting: Gastroenterology

## 2023-09-10 DIAGNOSIS — F422 Mixed obsessional thoughts and acts: Secondary | ICD-10-CM | POA: Diagnosis not present

## 2023-09-23 ENCOUNTER — Encounter: Payer: Managed Care, Other (non HMO) | Admitting: Gastroenterology

## 2023-09-24 DIAGNOSIS — F422 Mixed obsessional thoughts and acts: Secondary | ICD-10-CM | POA: Diagnosis not present

## 2023-10-08 DIAGNOSIS — F422 Mixed obsessional thoughts and acts: Secondary | ICD-10-CM | POA: Diagnosis not present

## 2023-10-21 DIAGNOSIS — F422 Mixed obsessional thoughts and acts: Secondary | ICD-10-CM | POA: Diagnosis not present

## 2023-11-04 ENCOUNTER — Encounter: Payer: Self-pay | Admitting: *Deleted

## 2023-11-04 ENCOUNTER — Ambulatory Visit (AMBULATORY_SURGERY_CENTER): Payer: Medicare Other | Admitting: *Deleted

## 2023-11-04 VITALS — Ht 73.0 in | Wt 235.0 lb

## 2023-11-04 DIAGNOSIS — Z860100 Personal history of colon polyps, unspecified: Secondary | ICD-10-CM

## 2023-11-04 MED ORDER — NA SULFATE-K SULFATE-MG SULF 17.5-3.13-1.6 GM/177ML PO SOLN: 1.0000 | Freq: Once | ORAL | 0 refills | Status: AC

## 2023-11-04 NOTE — Progress Notes (Signed)
Pre visit completed over telephone. Instructions mailed. Patient rescheduled to 11/18/23 due to currently taking NSAIDS for viral infection Patient advised to contact us if any changes in health, new medications. Or ER visits.  No egg or soy allergy known to patient  No issues known to pt with past sedation with any surgeries or procedures Patient denies ever being told they had issues or difficulty with intubation  No FH of Malignant Hyperthermia Pt is not on diet pills Pt is not on  home 02  Pt is not on blood thinners  Pt denies issues with constipation  No A fib or A flutter Have any cardiac testing pending--NO Pt instructed to use [email or web address] or GoodRx for a price reduction on prep

## 2023-11-05 ENCOUNTER — Encounter: Payer: Self-pay | Admitting: Gastroenterology

## 2023-11-05 DIAGNOSIS — F422 Mixed obsessional thoughts and acts: Secondary | ICD-10-CM | POA: Diagnosis not present

## 2023-11-11 ENCOUNTER — Encounter: Payer: Managed Care, Other (non HMO) | Admitting: Gastroenterology

## 2023-11-13 ENCOUNTER — Telehealth: Payer: Self-pay | Admitting: Gastroenterology

## 2023-11-13 NOTE — Telephone Encounter (Signed)
Spoke with patient. Per pt took 2 tylenol with ibuprofen after hurting his shoulder. Pt advised it is ok to proceed but to take extra strength tylenol from this point forward if he needs to take something for pain.

## 2023-11-13 NOTE — Telephone Encounter (Signed)
Inbound call from patient stating that he took muscle relaxer\ pain medication for his back yesterday and is requesting a call to discuss if he can still proceed with procedure on 12/16. Please advise.

## 2023-11-18 ENCOUNTER — Ambulatory Visit: Payer: Medicare Other | Admitting: Gastroenterology

## 2023-11-18 ENCOUNTER — Encounter: Payer: Self-pay | Admitting: Gastroenterology

## 2023-11-18 VITALS — BP 152/77 | HR 68 | Temp 99.0°F | Resp 12 | Ht 73.0 in | Wt 235.0 lb

## 2023-11-18 DIAGNOSIS — D123 Benign neoplasm of transverse colon: Secondary | ICD-10-CM

## 2023-11-18 DIAGNOSIS — D175 Benign lipomatous neoplasm of intra-abdominal organs: Secondary | ICD-10-CM | POA: Diagnosis not present

## 2023-11-18 DIAGNOSIS — D120 Benign neoplasm of cecum: Secondary | ICD-10-CM

## 2023-11-18 DIAGNOSIS — K635 Polyp of colon: Secondary | ICD-10-CM

## 2023-11-18 DIAGNOSIS — Z9889 Other specified postprocedural states: Secondary | ICD-10-CM

## 2023-11-18 DIAGNOSIS — Z1211 Encounter for screening for malignant neoplasm of colon: Secondary | ICD-10-CM | POA: Diagnosis not present

## 2023-11-18 DIAGNOSIS — D124 Benign neoplasm of descending colon: Secondary | ICD-10-CM

## 2023-11-18 DIAGNOSIS — K5730 Diverticulosis of large intestine without perforation or abscess without bleeding: Secondary | ICD-10-CM

## 2023-11-18 DIAGNOSIS — D122 Benign neoplasm of ascending colon: Secondary | ICD-10-CM

## 2023-11-18 DIAGNOSIS — Z860101 Personal history of adenomatous and serrated colon polyps: Secondary | ICD-10-CM | POA: Diagnosis not present

## 2023-11-18 DIAGNOSIS — Z860100 Personal history of colon polyps, unspecified: Secondary | ICD-10-CM

## 2023-11-18 DIAGNOSIS — D125 Benign neoplasm of sigmoid colon: Secondary | ICD-10-CM

## 2023-11-18 MED ORDER — SODIUM CHLORIDE 0.9 % IV SOLN: 500.0000 mL | INTRAVENOUS | Status: DC

## 2023-11-18 MED ORDER — DEXTROSE 50 % IV SOLN
25.0000 mL | Freq: Once | INTRAVENOUS | Status: AC
  Administered 2023-11-18: 25 mL via INTRAVENOUS

## 2023-11-18 NOTE — Patient Instructions (Signed)
Please read handouts provided. Continue present medications. Await pathology results. Repeat colonoscopy in 1 year for screening. Refer to a Runner, broadcasting/film/video.  YOU HAD AN ENDOSCOPIC PROCEDURE TODAY AT THE [facility]:   Refer to the procedure report that was given to you for any specific questions about what was found during the examination.  If the procedure report does not answer your questions, please call your gastroenterologist to clarify.  If you requested that your care partner not be given the details of your procedure findings, then the procedure report has been included in a sealed envelope for you to review at your convenience later.  YOU SHOULD EXPECT: Some feelings of bloating in the abdomen. Passage of more gas than usual.  Walking can help get rid of the air that was put into your GI tract during the procedure and reduce the bloating. If you had a lower endoscopy (such as a colonoscopy or flexible sigmoidoscopy) you may notice spotting of blood in your stool or on the toilet paper. If you underwent a bowel prep for your procedure, you may not have a normal bowel movement for a few days.  Please Note:  You might notice some irritation and congestion in your nose or some drainage.  This is from the oxygen used during your procedure.  There is no need for concern and it should clear up in a day or so.  SYMPTOMS TO REPORT IMMEDIATELY:  Following lower endoscopy (colonoscopy or flexible sigmoidoscopy):  Excessive amounts of blood in the stool  Significant tenderness or worsening of abdominal pains  Swelling of the abdomen that is new, acute  Fever of 100F or higher.  For urgent or emergent issues, a gastroenterologist can be reached at any hour by calling [phone]. Do not use MyChart messaging for urgent concerns.    DIET:  We do recommend a small meal at first, but then you may proceed to your regular diet.  Drink plenty of fluids but you should avoid  alcoholic beverages for 24 hours.  ACTIVITY:  You should plan to take it easy for the rest of today and you should NOT DRIVE or use heavy machinery until tomorrow (because of the sedation medicines used during the test).    FOLLOW UP: Our staff will call the number listed on your records the next business day following your procedure.  We will call around 7:15- 8:00 am to check on you and address any questions or concerns that you may have regarding the information given to you following your procedure. If we do not reach you, we will leave a message.     If any biopsies were taken you will be contacted by phone or by letter within the next 1-3 weeks.  Please call us at [phone] if you have not heard about the biopsies in 3 weeks.    SIGNATURES/CONFIDENTIALITY: You and/or your care partner have signed paperwork which will be entered into your electronic medical record.  These signatures attest to the fact that that the information above on your After Visit Summary has been reviewed and is understood.  Full responsibility of the confidentiality of this discharge information lies with you and/or your care-partner.

## 2023-11-18 NOTE — Progress Notes (Signed)
[facility] History and Physical   Primary Care Physician:  Geoffry Paradise, MD   Reason for Procedure:   History of colon polyps  Plan:    Colonoscopy     HPI: Joshua [name] is a 67 y.o. male undergoing surveillance colonoscopy.  He has no family history of colon cancer and no chronic GI symptoms.  He underwent colonoscopy in 2016 in which 13 polyps were removed.  Twelve polyps were adenomatous and 1 was inflammatory.  He was advised to repeat colonoscopy in 1 year and to undergo genetic testing.   Past Medical History:  Diagnosis Date   Allergy    Colon polyps    Diabetes mellitus without complication (HCC)    Family history of breast cancer in mother    Family history of prostate cancer in father    Hyperlipidemia    Hypertension    UTI (urinary tract infection) 2015    Past Surgical History:  Procedure Laterality Date   CERVICAL DISC SURGERY     C4,5   COLONOSCOPY     SURGERY SCROTAL / TESTICULAR     fluid drained as child   UMBILICAL HERNIA REPAIR  2024   WISDOM TOOTH EXTRACTION      Prior to Admission medications   Medication Sig Start Date End Date Taking? Authorizing Provider  ascorbic acid (VITAMIN C) 1000 MG tablet Take 1,000 mg by mouth daily. 07/23/23  Yes [provider]  cholecalciferol (VITAMIN D3) 25 MCG (1000 UNIT) tablet Take 1,000 Units by mouth daily.   Yes [provider]  Cyanocobalamin 3000 MCG SUBL Place 300 mcg under the tongue daily. 07/23/23  Yes [provider]  ibuprofen (ADVIL,MOTRIN) 200 MG tablet Take 200 mg by mouth. Take 3 pills prn   Yes [provider]  insulin glargine, 1 Unit Dial, (TOUJEO SOLOSTAR) 300 UNIT/ML Solostar Pen Inject into the skin. 09/03/23  Yes [provider]  Insulin Pen Needle (BD PEN NEEDLE NANO U/F) 32G X 4 MM MISC Use to inject insulin once daily as directed 11/15/14  Yes [provider]  losartan (COZAAR) 100 MG tablet Take 100 mg by mouth daily.    Yes [provider]  metFORMIN (GLUCOPHAGE) 1000 MG tablet Take 500 mg by mouth 2 (two) times daily with a meal.   Yes [provider]  methocarbamol (ROBAXIN) 500 MG tablet Take 1 tablet by mouth 3 (three) times daily as needed.   Yes [provider]  rosuvastatin (CRESTOR) 10 MG tablet Take 10 mg by mouth daily. 07/02/23  Yes [provider]  Turmeric 500 MG CAPS Take 500 mg by mouth daily. 07/23/23  Yes [provider]  insulin lispro (HUMALOG KWIKPEN) 100 UNIT/ML KwikPen Inject 7-10 Units into the skin 3 (three) times daily. 07/23/23   [provider]  tadalafil (CIALIS) 5 MG tablet TAKE ONE TABLET BY MOUTH ONCE DAILY IN THE EVENING FOR ENLARGED PROSTATE. MAY TAKE AN EXTRA DOSE 60 MINTUES PRIOR TO PLANNED RELATIONS UP TO 3 DAYS A WEEK. Patient not taking: Reported on 11/18/2023    [provider]    Current Outpatient Medications  Medication Sig Dispense Refill   ascorbic acid (VITAMIN C) 1000 MG tablet Take 1,000 mg by mouth daily.     cholecalciferol (VITAMIN D3) 25 MCG (1000 UNIT) tablet Take 1,000 Units by mouth daily.     Cyanocobalamin 3000 MCG SUBL Place 300 mcg under the tongue daily.     ibuprofen (ADVIL,MOTRIN) 200 MG tablet Take  200 mg by mouth. Take 3 pills prn     insulin glargine, 1 Unit Dial, (TOUJEO SOLOSTAR) 300 UNIT/ML Solostar Pen Inject into the skin.     Insulin Pen Needle (BD PEN NEEDLE NANO U/F) 32G X 4 MM MISC Use to inject insulin once daily as directed     losartan (COZAAR) 100 MG tablet Take 100 mg by mouth daily.     metFORMIN (GLUCOPHAGE) 1000 MG tablet Take 500 mg by mouth 2 (two) times daily with a meal.     methocarbamol (ROBAXIN) 500 MG tablet Take 1 tablet by mouth 3 (three) times daily as needed.     rosuvastatin (CRESTOR) 10 MG tablet Take 10 mg by mouth daily.     Turmeric 500 MG CAPS Take 500 mg by mouth daily.     insulin lispro (HUMALOG KWIKPEN) 100 UNIT/ML KwikPen Inject 7-10 Units into  the skin 3 (three) times daily.     tadalafil (CIALIS) 5 MG tablet TAKE ONE TABLET BY MOUTH ONCE DAILY IN THE EVENING FOR ENLARGED PROSTATE. MAY TAKE AN EXTRA DOSE 60 MINTUES PRIOR TO PLANNED RELATIONS UP TO 3 DAYS A WEEK. (Patient not taking: Reported on 11/18/2023)     Current Facility-Administered Medications  Medication Dose Route Frequency Provider Last Rate Last Admin   0.9 %  sodium chloride infusion  500 mL Intravenous Continuous Jenel Lucks, MD        Allergies as of 11/18/2023 - Review Complete 11/18/2023  Allergen Reaction Noted   Bee venom  12/10/2017   Dapagliflozin  03/23/2020   Prednisone Other (See Comments) 07/22/2023   Penicillins Other (See Comments) 11/29/2014    Family History  Problem Relation Age of Onset   Breast cancer Mother 61   Bone cancer Mother        started as breast, metastasized   Prostate cancer Father    Cancer Father 42       possible kidney and bladder cancer - heavy smoker   Colon cancer Sister    Brain cancer Maternal Aunt    Cancer Paternal Aunt        NOS   Cirrhosis Maternal Grandmother    Lung cancer Maternal Grandfather    Cancer Paternal Grandmother        NOS   Cancer Paternal Grandfather        NOS   Colon polyps Neg Hx    Esophageal cancer Neg Hx    Rectal cancer Neg Hx    Stomach cancer Neg Hx    Cancer Other    Diabetes Other    Stroke Other     Social History   Socioeconomic History   Marital status: Married    Spouse name: Not on file   Number of children: 2   Years of education: Not on file   Highest education level: Not on file  Occupational History   Not on file  Tobacco Use   Smoking status: Former    Current packs/day: 0.00    Types: Cigarettes    Quit date: 12/10/1973    Years since quitting: 49.9   Smokeless tobacco: Never  Vaping Use   Vaping status: Never Used  Substance and Sexual Activity   Alcohol use: Yes    Alcohol/week: 0.0 standard drinks of alcohol    Comment: socially   Drug  use: No   Sexual activity: Not on file  Other Topics Concern   Not on file  Social History Narrative   ** Merged  History Encounter **       Social Drivers of Corporate investment banker Strain: Not on file  Food Insecurity: Not on file  Transportation Needs: Not on file  Physical Activity: Not on file  Stress: Not on file  Social Connections: Not on file  Intimate Partner Violence: Not on file    Review of Systems:  All other review of systems negative except as mentioned in the HPI.  Physical Exam: Vital signs BP (!) 147/72   Pulse 76   Temp 99 F (37.2 C) (Temporal)   Resp 13   Ht 6\' 1"  (1.854 m)   Wt 235 lb (106.6 kg)   SpO2 94%   BMI 31.00 kg/m   General:   Alert,  Well-developed, well-nourished, pleasant and cooperative in NAD Airway:  Mallampati 3 Lungs:  Clear throughout to auscultation.   Heart:  Regular rate and rhythm; no murmurs, clicks, rubs,  or gallops. Abdomen:  Soft, nontender and nondistended. Normal bowel sounds.   Neuro/Psych:  Normal mood and affect. A and O x 3   Loye Vento E. Tomasa Rand, MD [facility]

## 2023-11-18 NOTE — Progress Notes (Signed)
Pt's states no medical or surgical changes since previsit or office visit. 

## 2023-11-18 NOTE — Progress Notes (Signed)
To pacu, VSs. Report to [email or web address] 

## 2023-11-18 NOTE — Progress Notes (Signed)
Called to room to assist during endoscopic procedure.  Patient ID and intended procedure confirmed with present staff. Received instructions for my participation in the procedure from the performing physician.  

## 2023-11-18 NOTE — Op Note (Signed)
[facility] Patient Name: Joshua [name] Procedure Date: 11/18/2023 2:51 PM MRN: [id number] Endoscopist: Lorin Picket E. Tomasa Rand , MD, [id number] Age: 67 Referring MD:  Date of Birth: Feb 22, 1956 Gender: Male Account #: [id number] Procedure:                Colonoscopy Indications:              High risk colon cancer surveillance: Personal                            history of multiple (3 or more) adenomas Medicines:                Monitored Anesthesia Care Procedure:                Pre-Anesthesia Assessment:                           - Prior to the procedure, a History and Physical                            was performed, and patient medications and                            allergies were reviewed. The patient's tolerance of                            previous anesthesia was also reviewed. The risks                            and benefits of the procedure and the sedation                            options and risks were discussed with the patient.                            All questions were answered, and informed consent                            was obtained. Prior Anticoagulants: The patient has                            taken no anticoagulant or antiplatelet agents. ASA                            Grade Assessment: II - A patient with mild systemic                            disease. After reviewing the risks and benefits,                            the patient was deemed in satisfactory condition to                            undergo the procedure.  After obtaining informed consent, the colonoscope                            was passed under direct vision. Throughout the                            procedure, the patient's blood pressure, pulse, and                            oxygen saturations were monitored continuously. The                            Olympus Scope SN: [id number] was introduced through                            the anus and  advanced to the the cecum, identified                            by appendiceal orifice and ileocecal valve. The                            colonoscopy was performed without difficulty. The                            patient tolerated the procedure well. The quality                            of the bowel preparation was adequate. The                            ileocecal valve, appendiceal orifice, and rectum                            were photographed. The bowel preparation used was                            GoLYTELY via split dose instruction. Scope In: 3:06:53 PM Scope Out: 3:52:02 PM Scope Withdrawal Time: 0 hours 42 minutes 1 second  Total Procedure Duration: 0 hours 45 minutes 9 seconds  Findings:                 The perianal and digital rectal examinations were                            normal. Pertinent negatives include normal                            sphincter tone and no palpable rectal lesions.                           Two sessile polyps were found in the cecum. The                            polyps were 2 to 3 mm in size.  These polyps were                            removed with a cold snare. Resection and retrieval                            were complete. Estimated blood loss was minimal.                           Six sessile polyps were found in the ascending                            colon. The polyps were 1 to 5 mm in size. These                            polyps were removed with a cold snare. Resection                            and retrieval were complete. Estimated blood loss                            was minimal.                           Two sessile polyps were found in the transverse                            colon. The polyps were 3 to 4 mm in size. These                            polyps were removed with a cold snare. Resection                            and retrieval were complete. Estimated blood loss                            was minimal.                            A 7 mm polyp was found in the transverse colon. The                            polyp was pedunculated. The polyp was removed with                            a cold snare. Resection and retrieval were                            complete. Estimated blood loss was minimal.                           Six sessile polyps were found in the descending  colon. The polyps were 2 to 5 mm in size. These                            polyps were removed with a cold snare. Resection                            and retrieval were complete. Estimated blood loss                            was minimal.                           Three sessile polyps were found in the sigmoid                            colon. The polyps were 3 to 4 mm in size. These                            polyps were removed with a cold snare. Resection                            and retrieval were complete. Estimated blood loss                            was minimal.                           A few small-mouthed diverticula were found in the                            sigmoid colon.                           A tattoo was seen in the sigmoid colon. The tattoo                            site appeared normal.                           There was a small lipoma, 10 mm in diameter, in the                            ascending colon.                           The exam was otherwise normal throughout the                            examined colon.                           The retroflexed view of the distal rectum and anal                            verge was  normal and showed no anal or rectal                            abnormalities. Complications:            No immediate complications. Estimated Blood Loss:     Estimated blood loss was minimal. Impression:               - Two 2 to 3 mm polyps in the cecum, removed with a                            cold snare. Resected and retrieved.                            - Six 1 to 5 mm polyps in the ascending colon,                            removed with a cold snare. Resected and retrieved.                           - Two 3 to 4 mm polyps in the transverse colon,                            removed with a cold snare. Resected and retrieved.                           - One 7 mm polyp in the transverse colon, removed                            with a cold snare. Resected and retrieved.                           - Six 2 to 5 mm polyps in the descending colon,                            removed with a cold snare. Resected and retrieved.                           - Three 3 to 4 mm polyps in the sigmoid colon,                            removed with a cold snare. Resected and retrieved.                           - Mild diverticulosis in the sigmoid colon.                           - A tattoo was seen in the sigmoid colon. The                            tattoo site appeared normal.                           -  Small lipoma in the ascending colon.                           - The distal rectum and anal verge are normal on                            retroflexion view. Recommendation:           - Patient has a contact number available for                            emergencies. The signs and symptoms of potential                            delayed complications were discussed with the                            patient. Return to normal activities tomorrow.                            Written discharge instructions were provided to the                            patient.                           - Resume previous diet.                           - Continue present medications.                           - Await pathology results.                           - Repeat colonoscopy in 1 year for surveillance.                           - Refer to a genetics counselor at appointment to                            be scheduled. Leodis Alcocer E. Tomasa Rand, MD 11/18/2023 4:04:41  PM This report has been signed electronically.

## 2023-11-19 ENCOUNTER — Other Ambulatory Visit: Payer: Self-pay

## 2023-11-19 ENCOUNTER — Telehealth: Payer: Self-pay

## 2023-11-19 DIAGNOSIS — Z803 Family history of malignant neoplasm of breast: Secondary | ICD-10-CM

## 2023-11-19 DIAGNOSIS — Z1379 Encounter for other screening for genetic and chromosomal anomalies: Secondary | ICD-10-CM

## 2023-11-19 DIAGNOSIS — Z8042 Family history of malignant neoplasm of prostate: Secondary | ICD-10-CM

## 2023-11-19 DIAGNOSIS — F422 Mixed obsessional thoughts and acts: Secondary | ICD-10-CM | POA: Diagnosis not present

## 2023-11-19 NOTE — Telephone Encounter (Signed)
  Follow up Call-     11/18/2023    2:02 PM  Call back number  Post procedure Call Back phone  # [phone]  Permission to leave phone message Yes     Patient questions:  Do you have a fever, pain , or abdominal swelling? No. Pain Score  0 *  Have you tolerated food without any problems? Yes.    Have you been able to return to your normal activities? Yes.    Do you have any questions about your discharge instructions: Diet   No. Medications  No. Follow up visit  No.  Do you have questions or concerns about your Care? No.  Actions: * If pain score is 4 or above: No action needed, pain <4.

## 2023-11-22 DIAGNOSIS — H524 Presbyopia: Secondary | ICD-10-CM | POA: Diagnosis not present

## 2023-11-22 LAB — SURGICAL PATHOLOGY

## 2023-11-24 NOTE — Progress Notes (Signed)
Mr. Menor,  42 of the polyps removed were adenomas.  These are considered precancerous polyps, meaning they would have had the potential to turn into cancer had they not been removed. Based on current national guidelines, I recommend you repeat colonoscopy in 1 year.  As we discussed, I also recommend you undergo genetic testing/counseling to check for known gene mutations that predispose patients to colon polyps and colon cancer.  The referral has been placed.  You should be contacted soon to set up the appointment.

## 2023-11-25 ENCOUNTER — Telehealth: Payer: Self-pay | Admitting: Genetic Counselor

## 2023-12-03 DIAGNOSIS — N401 Enlarged prostate with lower urinary tract symptoms: Secondary | ICD-10-CM | POA: Diagnosis not present

## 2023-12-03 DIAGNOSIS — E1159 Type 2 diabetes mellitus with other circulatory complications: Secondary | ICD-10-CM | POA: Diagnosis not present

## 2023-12-03 DIAGNOSIS — N521 Erectile dysfunction due to diseases classified elsewhere: Secondary | ICD-10-CM | POA: Diagnosis not present

## 2023-12-03 DIAGNOSIS — F422 Mixed obsessional thoughts and acts: Secondary | ICD-10-CM | POA: Diagnosis not present

## 2023-12-03 DIAGNOSIS — Z8042 Family history of malignant neoplasm of prostate: Secondary | ICD-10-CM | POA: Diagnosis not present

## 2023-12-17 DIAGNOSIS — R339 Retention of urine, unspecified: Secondary | ICD-10-CM | POA: Diagnosis not present

## 2023-12-17 DIAGNOSIS — N3001 Acute cystitis with hematuria: Secondary | ICD-10-CM | POA: Diagnosis not present

## 2023-12-17 DIAGNOSIS — N410 Acute prostatitis: Secondary | ICD-10-CM | POA: Diagnosis not present

## 2023-12-20 DIAGNOSIS — R310 Gross hematuria: Secondary | ICD-10-CM | POA: Diagnosis not present

## 2023-12-20 DIAGNOSIS — N401 Enlarged prostate with lower urinary tract symptoms: Secondary | ICD-10-CM | POA: Diagnosis not present

## 2023-12-20 DIAGNOSIS — Z8042 Family history of malignant neoplasm of prostate: Secondary | ICD-10-CM | POA: Diagnosis not present

## 2023-12-24 DIAGNOSIS — E1129 Type 2 diabetes mellitus with other diabetic kidney complication: Secondary | ICD-10-CM | POA: Diagnosis not present

## 2023-12-24 DIAGNOSIS — N401 Enlarged prostate with lower urinary tract symptoms: Secondary | ICD-10-CM | POA: Diagnosis not present

## 2023-12-24 DIAGNOSIS — R310 Gross hematuria: Secondary | ICD-10-CM | POA: Diagnosis not present

## 2023-12-24 DIAGNOSIS — Z8042 Family history of malignant neoplasm of prostate: Secondary | ICD-10-CM | POA: Diagnosis not present

## 2023-12-31 DIAGNOSIS — R310 Gross hematuria: Secondary | ICD-10-CM | POA: Diagnosis not present

## 2024-01-02 DIAGNOSIS — Z8042 Family history of malignant neoplasm of prostate: Secondary | ICD-10-CM | POA: Diagnosis not present

## 2024-01-02 DIAGNOSIS — R310 Gross hematuria: Secondary | ICD-10-CM | POA: Diagnosis not present

## 2024-01-02 DIAGNOSIS — N401 Enlarged prostate with lower urinary tract symptoms: Secondary | ICD-10-CM | POA: Diagnosis not present

## 2024-01-14 DIAGNOSIS — F422 Mixed obsessional thoughts and acts: Secondary | ICD-10-CM | POA: Diagnosis not present

## 2024-01-17 ENCOUNTER — Telehealth: Payer: Self-pay | Admitting: Genetic Counselor

## 2024-01-17 NOTE — Telephone Encounter (Signed)
Patient was called for friendly reminder of genetic counseling appt on 01/20/2024 1pm. He declined and asked for it to be canceled. States in past he has had 2 colonoscopies that showed on 1 result 13 polyps and on another 20 polyps. States they were removed and told not cancerous. He also added he received a bill for $10,000 that was resolved. He has had testing in 2016- why is he being tested again? Does it seem to be cancer? Can his DNA be modified so he wont have cancer. States his referring provider should be specific about the need of this test. I advised I would send his cancellation/concern to his referring provider courteously for him. He says he will keep on his appts of screening as he was negligent of keeping those. Thank you

## 2024-01-20 ENCOUNTER — Inpatient Hospital Stay: Payer: Medicare Other | Admitting: Genetic Counselor

## 2024-01-20 ENCOUNTER — Inpatient Hospital Stay: Payer: Medicare Other

## 2024-01-24 DIAGNOSIS — N401 Enlarged prostate with lower urinary tract symptoms: Secondary | ICD-10-CM | POA: Diagnosis not present

## 2024-01-24 DIAGNOSIS — Z8042 Family history of malignant neoplasm of prostate: Secondary | ICD-10-CM | POA: Diagnosis not present

## 2024-01-28 DIAGNOSIS — F422 Mixed obsessional thoughts and acts: Secondary | ICD-10-CM | POA: Diagnosis not present

## 2024-02-11 DIAGNOSIS — F422 Mixed obsessional thoughts and acts: Secondary | ICD-10-CM | POA: Diagnosis not present

## 2024-02-18 DIAGNOSIS — M48062 Spinal stenosis, lumbar region with neurogenic claudication: Secondary | ICD-10-CM | POA: Diagnosis not present

## 2024-02-18 DIAGNOSIS — E1129 Type 2 diabetes mellitus with other diabetic kidney complication: Secondary | ICD-10-CM | POA: Diagnosis not present

## 2024-02-24 DIAGNOSIS — N401 Enlarged prostate with lower urinary tract symptoms: Secondary | ICD-10-CM | POA: Diagnosis not present

## 2024-02-24 DIAGNOSIS — N529 Male erectile dysfunction, unspecified: Secondary | ICD-10-CM | POA: Diagnosis not present

## 2024-02-24 DIAGNOSIS — Z8042 Family history of malignant neoplasm of prostate: Secondary | ICD-10-CM | POA: Diagnosis not present

## 2024-03-10 DIAGNOSIS — F422 Mixed obsessional thoughts and acts: Secondary | ICD-10-CM | POA: Diagnosis not present

## 2024-03-24 DIAGNOSIS — F422 Mixed obsessional thoughts and acts: Secondary | ICD-10-CM | POA: Diagnosis not present

## 2024-04-07 DIAGNOSIS — F422 Mixed obsessional thoughts and acts: Secondary | ICD-10-CM | POA: Diagnosis not present

## 2024-05-18 DIAGNOSIS — Z1339 Encounter for screening examination for other mental health and behavioral disorders: Secondary | ICD-10-CM | POA: Diagnosis not present

## 2024-05-18 DIAGNOSIS — E785 Hyperlipidemia, unspecified: Secondary | ICD-10-CM | POA: Diagnosis not present

## 2024-05-18 DIAGNOSIS — R82998 Other abnormal findings in urine: Secondary | ICD-10-CM | POA: Diagnosis not present

## 2024-05-18 DIAGNOSIS — E1129 Type 2 diabetes mellitus with other diabetic kidney complication: Secondary | ICD-10-CM | POA: Diagnosis not present

## 2024-05-18 DIAGNOSIS — Z0000 Encounter for general adult medical examination without abnormal findings: Secondary | ICD-10-CM | POA: Diagnosis not present

## 2024-05-18 DIAGNOSIS — N401 Enlarged prostate with lower urinary tract symptoms: Secondary | ICD-10-CM | POA: Diagnosis not present

## 2024-05-18 DIAGNOSIS — Z1331 Encounter for screening for depression: Secondary | ICD-10-CM | POA: Diagnosis not present

## 2024-05-19 DIAGNOSIS — F422 Mixed obsessional thoughts and acts: Secondary | ICD-10-CM | POA: Diagnosis not present

## 2024-05-20 DIAGNOSIS — Z794 Long term (current) use of insulin: Secondary | ICD-10-CM | POA: Diagnosis not present

## 2024-06-01 DIAGNOSIS — N401 Enlarged prostate with lower urinary tract symptoms: Secondary | ICD-10-CM | POA: Diagnosis not present

## 2024-06-01 DIAGNOSIS — N529 Male erectile dysfunction, unspecified: Secondary | ICD-10-CM | POA: Diagnosis not present

## 2024-06-01 DIAGNOSIS — Z8042 Family history of malignant neoplasm of prostate: Secondary | ICD-10-CM | POA: Diagnosis not present

## 2024-06-02 DIAGNOSIS — L738 Other specified follicular disorders: Secondary | ICD-10-CM | POA: Diagnosis not present

## 2024-06-02 DIAGNOSIS — L570 Actinic keratosis: Secondary | ICD-10-CM | POA: Diagnosis not present

## 2024-06-02 DIAGNOSIS — F422 Mixed obsessional thoughts and acts: Secondary | ICD-10-CM | POA: Diagnosis not present

## 2024-06-02 DIAGNOSIS — L918 Other hypertrophic disorders of the skin: Secondary | ICD-10-CM | POA: Diagnosis not present

## 2024-06-02 DIAGNOSIS — D1801 Hemangioma of skin and subcutaneous tissue: Secondary | ICD-10-CM | POA: Diagnosis not present

## 2024-06-02 DIAGNOSIS — L237 Allergic contact dermatitis due to plants, except food: Secondary | ICD-10-CM | POA: Diagnosis not present

## 2024-06-16 DIAGNOSIS — F422 Mixed obsessional thoughts and acts: Secondary | ICD-10-CM | POA: Diagnosis not present

## 2024-07-28 DIAGNOSIS — F422 Mixed obsessional thoughts and acts: Secondary | ICD-10-CM | POA: Diagnosis not present

## 2024-08-03 DIAGNOSIS — E119 Type 2 diabetes mellitus without complications: Secondary | ICD-10-CM | POA: Diagnosis not present

## 2024-08-10 DIAGNOSIS — E1129 Type 2 diabetes mellitus with other diabetic kidney complication: Secondary | ICD-10-CM | POA: Diagnosis not present

## 2024-08-10 DIAGNOSIS — Z23 Encounter for immunization: Secondary | ICD-10-CM | POA: Diagnosis not present

## 2024-08-11 DIAGNOSIS — F422 Mixed obsessional thoughts and acts: Secondary | ICD-10-CM | POA: Diagnosis not present

## 2024-08-25 DIAGNOSIS — F422 Mixed obsessional thoughts and acts: Secondary | ICD-10-CM | POA: Diagnosis not present

## 2024-09-02 DIAGNOSIS — E119 Type 2 diabetes mellitus without complications: Secondary | ICD-10-CM | POA: Diagnosis not present

## 2024-09-08 DIAGNOSIS — F422 Mixed obsessional thoughts and acts: Secondary | ICD-10-CM | POA: Diagnosis not present

## 2024-09-22 DIAGNOSIS — E1122 Type 2 diabetes mellitus with diabetic chronic kidney disease: Secondary | ICD-10-CM | POA: Diagnosis not present

## 2024-09-22 DIAGNOSIS — F422 Mixed obsessional thoughts and acts: Secondary | ICD-10-CM | POA: Diagnosis not present

## 2024-10-03 DIAGNOSIS — E119 Type 2 diabetes mellitus without complications: Secondary | ICD-10-CM | POA: Diagnosis not present

## 2024-10-12 DIAGNOSIS — F422 Mixed obsessional thoughts and acts: Secondary | ICD-10-CM | POA: Diagnosis not present

## 2024-11-03 DIAGNOSIS — F422 Mixed obsessional thoughts and acts: Secondary | ICD-10-CM | POA: Diagnosis not present

## 2024-11-11 DIAGNOSIS — Z8042 Family history of malignant neoplasm of prostate: Secondary | ICD-10-CM | POA: Diagnosis not present

## 2024-11-11 DIAGNOSIS — N529 Male erectile dysfunction, unspecified: Secondary | ICD-10-CM | POA: Diagnosis not present

## 2024-11-11 DIAGNOSIS — F422 Mixed obsessional thoughts and acts: Secondary | ICD-10-CM | POA: Diagnosis not present

## 2024-11-11 DIAGNOSIS — N401 Enlarged prostate with lower urinary tract symptoms: Secondary | ICD-10-CM | POA: Diagnosis not present

## 2025-01-04 ENCOUNTER — Encounter: Payer: Self-pay | Admitting: Gastroenterology
